# Patient Record
Sex: Female | Born: 1960 | Race: White | Hispanic: No | Marital: Single | Smoking: Never smoker
Health system: Southern US, Community
[De-identification: ages and names within clinical notes are randomized; demographics above are authoritative.]

---

## 2021-05-31 ENCOUNTER — Other Ambulatory Visit (HOSPITAL_BASED_OUTPATIENT_CLINIC_OR_DEPARTMENT_OTHER): Payer: Self-pay

## 2021-05-31 ENCOUNTER — Encounter (HOSPITAL_BASED_OUTPATIENT_CLINIC_OR_DEPARTMENT_OTHER): Payer: Self-pay | Admitting: Nurse Practitioner

## 2021-05-31 ENCOUNTER — Other Ambulatory Visit: Payer: Self-pay

## 2021-05-31 ENCOUNTER — Ambulatory Visit (HOSPITAL_BASED_OUTPATIENT_CLINIC_OR_DEPARTMENT_OTHER): Payer: No Typology Code available for payment source | Admitting: Nurse Practitioner

## 2021-05-31 VITALS — BP 130/88 | HR 64 | Ht 63.0 in | Wt 202.6 lb

## 2021-05-31 DIAGNOSIS — Z1211 Encounter for screening for malignant neoplasm of colon: Secondary | ICD-10-CM

## 2021-05-31 DIAGNOSIS — Z1329 Encounter for screening for other suspected endocrine disorder: Secondary | ICD-10-CM

## 2021-05-31 DIAGNOSIS — J3489 Other specified disorders of nose and nasal sinuses: Secondary | ICD-10-CM | POA: Diagnosis not present

## 2021-05-31 DIAGNOSIS — Z1321 Encounter for screening for nutritional disorder: Secondary | ICD-10-CM

## 2021-05-31 DIAGNOSIS — Z130 Encounter for screening for diseases of the blood and blood-forming organs and certain disorders involving the immune mechanism: Secondary | ICD-10-CM

## 2021-05-31 DIAGNOSIS — E785 Hyperlipidemia, unspecified: Secondary | ICD-10-CM

## 2021-05-31 DIAGNOSIS — Z13228 Encounter for screening for other metabolic disorders: Secondary | ICD-10-CM

## 2021-05-31 MED ORDER — MUPIROCIN 2 % EX OINT
TOPICAL_OINTMENT | CUTANEOUS | 3 refills | Status: DC
  Filled 2021-05-31: qty 22, 14d supply, fill #0

## 2021-05-31 MED ORDER — SULFAMETHOXAZOLE-TRIMETHOPRIM 800-160 MG PO TABS
1.0000 | ORAL_TABLET | Freq: Two times a day (BID) | ORAL | 0 refills | Status: DC
  Filled 2021-05-31: qty 10, 5d supply, fill #0

## 2021-05-31 MED ORDER — ZOSTER VAC RECOMB ADJUVANTED 50 MCG/0.5ML IM SUSR
0.5000 mL | Freq: Once | INTRAMUSCULAR | 1 refills | Status: DC
  Filled 2021-05-31: qty 0.5, 1d supply, fill #0

## 2021-05-31 NOTE — Progress Notes (Signed)
Tollie Eth, DNP, AGNP-c Primary Care & Sports Medicine [address]   [address] [place], [place] [address] [phone]  New patient visit   Patient: Carrie Garrison   DOB: 11-Oct-1960   60 y.o. Female  MRN: [id number] Visit Date: 05/31/2021  Patient Care Team: Daeja Helderman, Sung Amabile, NP as PCP - General (Nurse Practitioner)  Today's healthcare provider: Tollie Eth, NP   Chief Complaint  Patient presents with   New Patient (Initial Visit)   Subjective    Carrie Garrison is a 61 y.o. female who presents today as a new patient to establish care.    Patient endorses the following concerns presently: Recent move back to states after living in [place] for 4 years. No pcp in the last 4 years.   Nasal irritation and sore that is recurrent. Has used ointment provided by doctor in [place] (unsure of the name) and antifungal provided by doctor here. Nothing helping. Reports the sore will begin to heal then comes back. Occasionally experiencing fissuring of the skin at the nostril when the sore is present. No bleeding, pus, redness, warmth. Did take antibiotics at one point and it was improving but returned when she stopped. No hx of cold sores. Has been present for about 2 years on and off.   She tells me she follows a strict Keto diet and gets regular physical activity. She has been seeing a functional medical doctor. History reviewed and reveals the following: History reviewed. No pertinent past medical history. History reviewed. No pertinent surgical history. No family status information on file.   History reviewed. No pertinent family history. Social History   Socioeconomic History   Marital status: Single    Spouse name: Not on file   Number of children: Not on file   Years of education: Not on file   Highest education level: Not on file  Occupational History   Not on file  Tobacco Use   Smoking status: Not on file   Smokeless tobacco: Not on file  Substance  and Sexual Activity   Alcohol use: Not on file   Drug use: Not on file   Sexual activity: Not on file  Other Topics Concern   Not on file  Social History Narrative   Not on file   Social Determinants of Health   Financial Resource Strain: Not on file  Food Insecurity: Not on file  Transportation Needs: Not on file  Physical Activity: Not on file  Stress: Not on file  Social Connections: Not on file   Outpatient Medications Prior to Visit  Medication Sig   metroNIDAZOLE (METROCREAM) 0.75 % cream Apply topically 2 (two) times daily.   No facility-administered medications prior to visit.   No Known Allergies Immunization History  Administered Date(s) Administered   Influenza-Unspecified 01/21/2021    Health Maintenance Due: Health Maintenance  Topic Date Due   HIV Screening  Never done   Hepatitis C Screening  Never done   TETANUS/TDAP  Never done   PAP SMEAR-Modifier  Never done   COLONOSCOPY (Pts 45-73yrs Insurance coverage will need to be confirmed)  Never done   MAMMOGRAM  Never done   Zoster Vaccines- Shingrix (1 of 2) Never done   INFLUENZA VACCINE  Completed   HPV VACCINES  Aged Out    Review of Systems All review of systems negative except what is listed in the HPI   Objective    There were no vitals taken for this visit. Physical Exam  Vitals and nursing note reviewed.  Constitutional:      General: She is not in acute distress.    Appearance: Normal appearance.  HENT:     Nose:     Comments: Mild erythema to the internal mucosal surface of the right nare on the anterior side. No signs of bleeding. There appears to be dried exudate present- non purulent.  Eyes:     Extraocular Movements: Extraocular movements intact.     Conjunctiva/sclera: Conjunctivae normal.     Pupils: Pupils are equal, round, and reactive to light.  Neck:     Vascular: No carotid bruit.  Cardiovascular:     Rate and Rhythm: Normal rate and regular rhythm.     Pulses: Normal  pulses.     Heart sounds: Normal heart sounds. No murmur heard. Pulmonary:     Effort: Pulmonary effort is normal.     Breath sounds: Normal breath sounds. No wheezing.  Abdominal:     General: Bowel sounds are normal.     Palpations: Abdomen is soft.  Musculoskeletal:        General: Normal range of motion.     Cervical back: Normal range of motion.     Right lower leg: No edema.     Left lower leg: No edema.  Skin:    General: Skin is warm and dry.     Capillary Refill: Capillary refill takes less than 2 seconds.  Neurological:     General: No focal deficit present.     Mental Status: She is alert and oriented to person, place, and time.  Psychiatric:        Mood and Affect: Mood normal.        Behavior: Behavior normal.        Thought Content: Thought content normal.        Judgment: Judgment normal.    Results for orders placed or performed in visit on 05/31/21  CBC with Differential/Platelet  Result Value Ref Range   WBC 5.9 3.4 - 10.8 x10E3/uL   RBC 4.83 3.77 - 5.28 x10E6/uL   Hemoglobin 13.9 11.1 - 15.9 g/dL   Hematocrit 43.0 34.0 - 46.6 %   MCV 89 79 - 97 fL   MCH 28.8 26.6 - 33.0 pg   MCHC 32.3 31.5 - 35.7 g/dL   RDW 12.6 11.7 - 15.4 %   Platelets 255 150 - 450 x10E3/uL   Neutrophils 62 Not Estab. %   Lymphs 27 Not Estab. %   Monocytes 8 Not Estab. %   Eos 2 Not Estab. %   Basos 1 Not Estab. %   Neutrophils Absolute 3.7 1.4 - 7.0 x10E3/uL   Lymphocytes Absolute 1.6 0.7 - 3.1 x10E3/uL   Monocytes Absolute 0.5 0.1 - 0.9 x10E3/uL   EOS (ABSOLUTE) 0.1 0.0 - 0.4 x10E3/uL   Basophils Absolute 0.1 0.0 - 0.2 x10E3/uL   Immature Granulocytes 0 Not Estab. %   Immature Grans (Abs) 0.0 0.0 - 0.1 x10E3/uL  Lipid panel  Result Value Ref Range   Cholesterol, Total 302 (H) 100 - 199 mg/dL   Triglycerides 178 (H) 0 - 149 mg/dL   HDL 60 >39 mg/dL   VLDL Cholesterol Cal 34 5 - 40 mg/dL   LDL Chol Calc (NIH) 208 (H) 0 - 99 mg/dL   Lipid Comment: Comment    Chol/HDL  Ratio 5.0 (H) 0.0 - 4.4 ratio  Hemoglobin A1c  Result Value Ref Range   Hgb A1c MFr Bld 5.6 4.8 -  5.6 %   Est. average glucose Bld gHb Est-mCnc 114 mg/dL  TSH  Result Value Ref Range   TSH 1.180 0.450 - 4.500 uIU/mL  VITAMIN D 25 Hydroxy (Vit-D Deficiency, Fractures)  Result Value Ref Range   Vit D, 25-Hydroxy 52.6 30.0 - 100.0 ng/mL    Assessment & Plan      Problem List Items Addressed This Visit   None Visit Diagnoses     Screening for colon cancer    -  Primary   Relevant Orders   Cologuard   Screening for endocrine, nutritional, metabolic and immunity disorder       Relevant Orders   CBC with Differential/Platelet (Completed)   Lipid panel (Completed)   Hemoglobin A1c (Completed)   TSH (Completed)   VITAMIN D 25 Hydroxy (Vit-D Deficiency, Fractures) (Completed)   Nasal lesion       Relevant Medications   sulfamethoxazole-trimethoprim (BACTRIM DS) 800-160 MG tablet   mupirocin ointment (BACTROBAN) 2 %   Hyperlipidemia, unspecified hyperlipidemia type       Relevant Orders   Lipid panel (Completed)        Return for based on labs.     Time: 40 minutes, >50% spent counseling, care coordination, chart review, and documentation.    Mohmed Farver, Coralee Pesa, NP, DNP, AGNP-C Primary Care & Sports Medicine at [place]

## 2021-05-31 NOTE — Patient Instructions (Signed)
Thank you for choosing [facility] for your Primary Care needs. I am excited for the opportunity to partner with you to meet your health care goals. It was a pleasure meeting you today!  Recommendations from today's visit: We will try the oral antibiotic and antibiotic cream to see if this helps the nose ulceration.  I will let you know if we have any concerns with your labs.  I sent the cologaurd order in for you- they will mail the package and instructions to your home.   Information on diet, exercise, and health maintenance recommendations are listed below. This is information to help you be sure you are on track for optimal health and monitoring.   Please look over this and let us know if you have any questions or if you have completed any of the health maintenance outside of [facility] so that we can be sure your records are up to date.  ___________________________________________________________ About Me: I am an Adult-Geriatric Nurse Practitioner with a background in caring for patients for more than 20 years with a strong intensive care background. I provide primary care and sports medicine services to patients age 10 and older within this office. My education had a strong focus on caring for the older adult population, which I am passionate about. I am also the director of the APP Fellowship with [facility].   My desire is to provide you with the best service through preventive medicine and supportive care. I consider you a part of the medical team and value your input. I work diligently to ensure that you are heard and your needs are met in a safe and effective manner. I want you to feel comfortable with me as your provider and want you to know that your health concerns are important to me.  For your information, our office hours are: Monday, Tuesday, and Thursday 8:00 AM - 5:00 PM Wednesday and Friday 8:00 AM - 12:00 PM.   In my time away from the  office I am teaching new APP's within the system and am unavailable, but my partner, Dr. Burnard Bunting is in the office for emergent needs.   If you have questions or concerns, please call our office at [phone] or send [place] a MyChart message and we will respond as quickly as possible.  ____________________________________________________________ MyChart:  For all urgent or time sensitive needs we ask that you please call the office to avoid delays. Our number is [phone]. MyChart is not constantly monitored and due to the large volume of messages a day, replies may take up to 72 business hours.  MyChart Policy: MyChart allows for you to see your visit notes, after visit summary, provider recommendations, lab and tests results, make an appointment, request refills, and contact your provider or the office for non-urgent questions or concerns. Providers are seeing patients during normal business hours and do not have built in time to review MyChart messages.  We ask that you allow a minimum of 3 business days for responses to [employer]. For this reason, please do not send urgent requests through [place]. Please call the office at [phone]. New and ongoing conditions may require a visit. We have virtual and in person visit available for your convenience.  Complex MyChart concerns may require a visit. Your provider may request you schedule a virtual or in person visit to ensure we are providing the best care possible. MyChart messages sent after 11:00 AM on Friday will not be  received by the provider until Monday morning.    Lab and Test Results: You will receive your lab and test results on MyChart as soon as they are completed and results have been sent by the lab or testing facility. Due to this service, you will receive your results BEFORE your provider.  I review lab and tests results each morning prior to seeing patients. Some results require collaboration with other providers to  ensure you are receiving the most appropriate care. For this reason, we ask that you please allow a minimum of 3-5 business days from the time the ALL results have been received for your provider to receive and review lab and test results and contact you about these.  Most lab and test result comments from the provider will be sent through [place]. Your provider may recommend changes to the plan of care, follow-up visits, repeat testing, ask questions, or request an office visit to discuss these results. You may reply directly to this message or call the office at [phone] to provide information for the provider or set up an appointment. In some instances, you will be called with test results and recommendations. Please let us know if this is preferred and we will make note of this in your chart to provide this for you.    If you have not heard a response to your lab or test results in 5 business days from all results returning to [facility], please call the office to let us know. We ask that you please avoid calling prior to this time unless there is an emergent concern. Due to high call volumes, this can delay the resulting process.  After Hours: For all non-emergency after hours needs, please call the office at [phone] and select the option to reach the on-call provider service. On-call services are shared between multiple [place] offices and therefore it will not be possible to speak directly with your provider. On-call providers may provide medical advice and recommendations, but are unable to provide refills for maintenance medications.  For all emergency or urgent medical needs after normal business hours, we recommend that you seek care at the closest Urgent Care or Emergency Department to ensure appropriate treatment in a timely manner.  [facility] [place] has a 24 hour emergency room located on the ground floor for your convenience.   Urgent Concerns During the Business  Day Providers are seeing patients from 8AM to [place] with a busy schedule and are most often not able to respond to non-urgent calls until the end of the day or the next business day. If you should have URGENT concerns during the day, please call and speak to the nurse or schedule a same day appointment so that we can address your concern without delay.   Thank you, again, for choosing me as your health care partner. I appreciate your trust and look forward to learning more about you.   Worthy Keeler, DNP, AGNP-c ___________________________________________________________  Health Maintenance Recommendations Screening Testing Mammogram Every 1 -2 years based on history and risk factors Starting at age 81 Pap Smear Ages 21-39 every 3 years Ages 7-65 every 5 years with HPV testing More frequent testing may be required based on results and history Colon Cancer Screening Every 1-10 years based on test performed, risk factors, and history Starting at age 37 Bone Density Screening Every 2-10 years based on history Starting at age 46 for women Recommendations for men differ based on medication usage, history, and risk factors AAA  Screening One time ultrasound Men 12-55 years old who have every smoked Lung Cancer Screening Low Dose Lung CT every 12 months Age 31-80 years with a 30 pack-year smoking history who still smoke or who have quit within the last 15 years  Screening Labs Routine  Labs: Complete Blood Count (CBC), Complete Metabolic Panel (CMP), Cholesterol (Lipid Panel) Every 6-12 months based on history and medications May be recommended more frequently based on current conditions or previous results Hemoglobin A1c Lab Every 3-12 months based on history and previous results Starting at age 62 or earlier with diagnosis of diabetes, high cholesterol, BMI >26, and/or risk factors Frequent monitoring for patients with diabetes to ensure blood sugar control Thyroid Panel (TSH w/ T3  & T4) Every 6 months based on history, symptoms, and risk factors May be repeated more often if on medication HIV One time testing for all patients 27 and older May be repeated more frequently for patients with increased risk factors or exposure Hepatitis C One time testing for all patients 61 and older May be repeated more frequently for patients with increased risk factors or exposure Gonorrhea, Chlamydia Every 12 months for all sexually active persons 13-24 years Additional monitoring may be recommended for those who are considered high risk or who have symptoms PSA Men 19-43 years old with risk factors Additional screening may be recommended from age 43-69 based on risk factors, symptoms, and history  Vaccine Recommendations Tetanus Booster All adults every 10 years Flu Vaccine All patients 6 months and older every year COVID Vaccine All patients 12 years and older Initial dosing with booster May recommend additional booster based on age and health history HPV Vaccine 2 doses all patients age 49-26 Dosing may be considered for patients over 26 Shingles Vaccine (Shingrix) 2 doses all adults 25 years and older Pneumonia (Pneumovax 23) All adults 62 years and older May recommend earlier dosing based on health history Pneumonia (Prevnar 35) All adults 65 years and older Dosed 1 year after Pneumovax 23  Additional Screening, Testing, and Vaccinations may be recommended on an individualized basis based on family history, health history, risk factors, and/or exposure.  __________________________________________________________  Diet Recommendations for All Patients  I recommend that all patients maintain a diet low in saturated fats, carbohydrates, and cholesterol. While this can be challenging at first, it is not impossible and small changes can make big differences.  Things to try: Decreasing the amount of soda, sweet tea, and/or juice to one or less per day and replace with  water While water is always the first choice, if you do not like water you may consider adding a water additive without sugar to improve the taste other sugar free drinks Replace potatoes with a brightly colored vegetable at dinner Use healthy oils, such as canola oil or olive oil, instead of butter or hard margarine Limit your bread intake to two pieces or less a day Replace regular pasta with low carb pasta options Bake, broil, or grill foods instead of frying Monitor portion sizes  Eat smaller, more frequent meals throughout the day instead of large meals  An important thing to remember is, if you love foods that are not great for your health, you don't have to give them up completely. Instead, allow these foods to be a reward when you have done well. Allowing yourself to still have special treats every once in a while is a nice way to tell yourself thank you for working hard to keep yourself healthy.  Also remember that every day is a new day. If you have a bad day and "fall off the wagon", you can still climb right back up and keep moving along on your journey!  We have resources available to help you!  Some websites that may be helpful include: www.http://carter.biz/  Www.VeryWellFit.com _____________________________________________________________  Activity Recommendations for All Patients  I recommend that all adults get at least 20 minutes of moderate physical activity that elevates your heart rate at least 5 days out of the week.  Some examples include: Walking or jogging at a pace that allows you to carry on a conversation Cycling (stationary bike or outdoors) Water aerobics Yoga Weight lifting Dancing If physical limitations prevent you from putting stress on your joints, exercise in a pool or seated in a chair are excellent options.  Do determine your MAXIMUM heart rate for activity: YOUR AGE - 220 = MAX HeartRate   Remember! Do not push yourself too hard.  Start slowly  and build up your pace, speed, weight, time in exercise, etc.  Allow your body to rest between exercise and get good sleep. You will need more water than normal when you are exerting yourself. Do not wait until you are thirsty to drink. Drink with a purpose of getting in at least 8, 8 ounce glasses of water a day plus more depending on how much you exercise and sweat.    If you begin to develop dizziness, chest pain, abdominal pain, jaw pain, shortness of breath, headache, vision changes, lightheadedness, or other concerning symptoms, stop the activity and allow your body to rest. If your symptoms are severe, seek emergency evaluation immediately. If your symptoms are concerning, but not severe, please let us know so that we can recommend further evaluation.

## 2021-06-01 LAB — CBC WITH DIFFERENTIAL/PLATELET
Basophils Absolute: 0.1 10*3/uL (ref 0.0–0.2)
Basos: 1 %
EOS (ABSOLUTE): 0.1 10*3/uL (ref 0.0–0.4)
Eos: 2 %
Hematocrit: 43 % (ref 34.0–46.6)
Hemoglobin: 13.9 g/dL (ref 11.1–15.9)
Immature Grans (Abs): 0 10*3/uL (ref 0.0–0.1)
Immature Granulocytes: 0 %
Lymphocytes Absolute: 1.6 10*3/uL (ref 0.7–3.1)
Lymphs: 27 %
MCH: 28.8 pg (ref 26.6–33.0)
MCHC: 32.3 g/dL (ref 31.5–35.7)
MCV: 89 fL (ref 79–97)
Monocytes Absolute: 0.5 10*3/uL (ref 0.1–0.9)
Monocytes: 8 %
Neutrophils Absolute: 3.7 10*3/uL (ref 1.4–7.0)
Neutrophils: 62 %
Platelets: 255 10*3/uL (ref 150–450)
RBC: 4.83 x10E6/uL (ref 3.77–5.28)
RDW: 12.6 % (ref 11.7–15.4)
WBC: 5.9 10*3/uL (ref 3.4–10.8)

## 2021-06-01 LAB — LIPID PANEL
Chol/HDL Ratio: 5 ratio — ABNORMAL HIGH (ref 0.0–4.4)
Cholesterol, Total: 302 mg/dL — ABNORMAL HIGH (ref 100–199)
HDL: 60 mg/dL (ref >39)
LDL Chol Calc (NIH): 208 mg/dL — ABNORMAL HIGH (ref 0–99)
Triglycerides: 178 mg/dL — ABNORMAL HIGH (ref 0–149)
VLDL Cholesterol Cal: 34 mg/dL (ref 5–40)

## 2021-06-01 LAB — HEMOGLOBIN A1C
Est. average glucose Bld gHb Est-mCnc: 114 mg/dL
Hgb A1c MFr Bld: 5.6 % (ref 4.8–5.6)

## 2021-06-01 LAB — VITAMIN D 25 HYDROXY (VIT D DEFICIENCY, FRACTURES): Vit D, 25-Hydroxy: 52.6 ng/mL (ref 30.0–100.0)

## 2021-06-01 LAB — TSH: TSH: 1.18 u[IU]/mL (ref 0.450–4.500)

## 2021-06-02 ENCOUNTER — Encounter (HOSPITAL_BASED_OUTPATIENT_CLINIC_OR_DEPARTMENT_OTHER): Payer: Self-pay | Admitting: Nurse Practitioner

## 2021-06-02 DIAGNOSIS — E7801 Familial hypercholesterolemia: Secondary | ICD-10-CM | POA: Insufficient documentation

## 2021-06-02 DIAGNOSIS — J3489 Other specified disorders of nose and nasal sinuses: Secondary | ICD-10-CM | POA: Insufficient documentation

## 2021-06-02 DIAGNOSIS — E785 Hyperlipidemia, unspecified: Secondary | ICD-10-CM | POA: Insufficient documentation

## 2021-06-02 NOTE — Assessment & Plan Note (Signed)
Hx of elevated lipids. Will obtain labs today for evaluation and make changes to plan of care as necessary based on findings.

## 2021-06-02 NOTE — Assessment & Plan Note (Signed)
Etiology unclear. It appears she has failed topical antibacterial and antifungal treatment measures.  Will try treatment with oral antibiotic therapy and topical mupirocin to see if we can get this cleared.  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  She will follow-up if no improvement.

## 2021-06-02 NOTE — Assessment & Plan Note (Signed)
>>  ASSESSMENT AND PLAN FOR HYPERLIPIDEMIA WRITTEN ON 06/02/2021  8:24 AM BY Betsi Crespi E, NP  Hx of elevated lipids. Will obtain labs today for evaluation and make changes to plan of care as necessary based on findings.

## 2021-06-05 ENCOUNTER — Other Ambulatory Visit (HOSPITAL_BASED_OUTPATIENT_CLINIC_OR_DEPARTMENT_OTHER): Payer: Self-pay

## 2021-06-05 ENCOUNTER — Telehealth (HOSPITAL_BASED_OUTPATIENT_CLINIC_OR_DEPARTMENT_OTHER): Payer: Self-pay

## 2021-06-05 MED ORDER — ROSUVASTATIN CALCIUM 20 MG PO TABS
20.0000 mg | ORAL_TABLET | Freq: Every day | ORAL | 0 refills | Status: DC
  Filled 2021-06-05: qty 30, 30d supply, fill #0
  Filled 2021-07-02: qty 30, 30d supply, fill #1
  Filled 2021-08-07: qty 30, 30d supply, fill #2

## 2021-06-05 NOTE — Telephone Encounter (Signed)
Spoke with regarding lab results. Rosuvastatin was recommend for patient she was agreeable to this and ask that the medication be called into [facility].

## 2021-06-05 NOTE — Telephone Encounter (Signed)
-----   Message from Tollie Eth, NP sent at 06/01/2021  7:03 PM EST ----- Please call patient.  Cholesterol is significantly elevated.  If patient was fasting at the time of labs we will need to get her started on medication to help better control this.  If she is agreeable recommend rosuvastatin 20 mg at bedtime daily for hyperlipidemia. If patient was not fasting we will plan to recheck the labs in approximately 12 weeks with fasting labs.  CBC shows no signs of anemia or infection present. Hemoglobin A1c is within normal range.  No signs of diabetes or prediabetes at this time.  Thyroid panel is normal. Vitamin D looks fantastic!  Current recommendations are for rosuvastatin 20 mg once daily at bedtime.  We will need to set up lab recheck in approximately 12 weeks for CMP and lipids after start.

## 2021-06-06 ENCOUNTER — Other Ambulatory Visit (HOSPITAL_BASED_OUTPATIENT_CLINIC_OR_DEPARTMENT_OTHER): Payer: Self-pay

## 2021-06-06 ENCOUNTER — Emergency Department (HOSPITAL_BASED_OUTPATIENT_CLINIC_OR_DEPARTMENT_OTHER)
Admission: EM | Admit: 2021-06-06 | Discharge: 2021-06-06 | Disposition: A | Discharge status: Home / Self Care | Payer: No Typology Code available for payment source | Attending: Emergency Medicine | Admitting: Emergency Medicine

## 2021-06-06 ENCOUNTER — Encounter (HOSPITAL_BASED_OUTPATIENT_CLINIC_OR_DEPARTMENT_OTHER): Payer: Self-pay | Admitting: Emergency Medicine

## 2021-06-06 ENCOUNTER — Other Ambulatory Visit: Payer: Self-pay

## 2021-06-06 DIAGNOSIS — L270 Generalized skin eruption due to drugs and medicaments taken internally: Secondary | ICD-10-CM | POA: Diagnosis not present

## 2021-06-06 DIAGNOSIS — R21 Rash and other nonspecific skin eruption: Secondary | ICD-10-CM | POA: Diagnosis present

## 2021-06-06 MED ORDER — ZOSTER VAC RECOMB ADJUVANTED 50 MCG/0.5ML IM SUSR
INTRAMUSCULAR | 1 refills | Status: DC
  Filled 2021-06-06: qty 0.5, 1d supply, fill #0

## 2021-06-06 NOTE — ED Provider Notes (Signed)
[facility] EMERGENCY DEPT Provider Note   CSN: [id number] Arrival date & time: 06/06/21  5277     History  No chief complaint on file.   Terryn Rosenkranz is a 61 y.o. female.  HPI  61 year old female presenting to the emergency department with a chief complaint of rash. The patient recently started taking Inositol this past Friday, Saturday, and Sunday.  Additionally, the patient recently completed a course of Bactrim on Sunday.  This morning she woke up with a diffuse maculopapular rash that covers her entire body from her extremities to her torso. It spares the palms and soles. She is up to date on all of her vaccines. No fever or chills. She has been having night sweats. No tick exposure that she can think of.   Home Medications Prior to Admission medications   Medication Sig Start Date End Date Taking? Authorizing Provider  rosuvastatin (CRESTOR) 20 MG tablet Take 1 tablet (20 mg total) by mouth at bedtime. 06/05/21   Tollie Eth, NP  metroNIDAZOLE (METROCREAM) 0.75 % cream Apply topically 2 (two) times daily. 03/09/21   [provider]  mupirocin ointment (BACTROBAN) 2 % Apply to affected area BID for 14 days. 05/31/21   Tollie Eth, NP  Zoster Vaccine Adjuvanted [facility]) injection Inject into the muscle. 06/06/21         Allergies    Bactrim [sulfamethoxazole-trimethoprim]    Review of Systems   Review of Systems  Skin:  Positive for rash.  All other systems reviewed and are negative.  Physical Exam Updated Vital Signs BP 124/68    Pulse (!) 59    Temp 97.7 F (36.5 C) (Oral)    Resp 16    Ht 5\' 3"  (1.6 m)    Wt 88.5 kg    SpO2 97%    BMI 34.54 kg/m  Physical Exam Vitals and nursing note reviewed.  Constitutional:      General: She is not in acute distress. HENT:     Head: Normocephalic and atraumatic.  Eyes:     Conjunctiva/sclera: Conjunctivae normal.     Pupils: Pupils are equal, round, and reactive to light.  Cardiovascular:      Rate and Rhythm: Normal rate and regular rhythm.  Pulmonary:     Effort: Pulmonary effort is normal. No respiratory distress.  Abdominal:     General: There is no distension.     Tenderness: There is no guarding.  Musculoskeletal:        General: No deformity or signs of injury.     Cervical back: Neck supple.  Skin:    Findings: No lesion or rash.     Comments: Diffuse  blanching, erythematous maculopapular rash along the patient's torso, abdomen, all 4 extremities, sparing the palms and soles.  No mucous membrane involvement.  Neurological:     General: No focal deficit present.     Mental Status: She is alert. Mental status is at baseline.    ED Results / Procedures / Treatments   Labs (all labs ordered are listed, but only abnormal results are displayed) Labs Reviewed - No data to display  EKG None  Radiology No results found.  Procedures Procedures    Medications Ordered in ED Medications - No data to display  ED Course/ Medical Decision Making/ A&P                           Medical Decision Making   61 year old  female presenting to the emergency department with a chief complaint of rash. The patient recently started taking Inositol this past Friday, Saturday, and Sunday.  Additionally, the patient recently completed a course of Bactrim on Sunday.  This morning she woke up with a diffuse maculopapular rash that covers her entire body from her extremities to her torso. It spares the palms and soles. She is up to date on all of her vaccines. No fever or chills. She has been having night sweats. No tick exposure that she can think of.   On arrival, the patient was afebrile, hemodynamically stable.  Her physical exam was significant for a diffuse, erythematous, blanching maculopapular rash that covers the patient's torso, abdomen in all 4 extremities.  This rash appeared a few days after she finished a course of Bactrim.  She has no known prior history of allergic reaction  to Bactrim or sulfa drugs.  Suspect likely drug rash.  The patient has ceased the offending agent.  Low suspicion for SJS, TENS at this time.  The rash spares the palms and soles and the patient is afebrile and overall well-appearing.  She denies any mucosal involvement of her vagina or mouth.  The patient was offered topical corticosteroids for symptomatic management but declined.  I discussed return precautions in the event of worsening symptoms of SJS.  Bactrim was added to the patient's allergy list and she was advised to avoid that drug and sulfa containing drugs in the future.  Advised routine outpatient follow-up.    Final Clinical Impression(s) / ED Diagnoses Final diagnoses:  Drug eruption    Rx / DC Orders ED Discharge Orders     None         Ernie Avena, MD 06/06/21 2234

## 2021-06-06 NOTE — ED Triage Notes (Signed)
Pt reports waking up with rash all over her body. Redness worse on back. Denies SOB.

## 2021-06-06 NOTE — Discharge Instructions (Addendum)
Your symptoms are concerning for a fixed drug eruption after Bactrim use.  Recommend discontinuing the medication.  Return for any mucous membrane involvement to include desquamation or sloughing.

## 2021-06-08 ENCOUNTER — Encounter (HOSPITAL_BASED_OUTPATIENT_CLINIC_OR_DEPARTMENT_OTHER): Payer: Self-pay | Admitting: Nurse Practitioner

## 2021-06-11 LAB — COLOGUARD: COLOGUARD: NEGATIVE

## 2021-06-12 ENCOUNTER — Encounter (HOSPITAL_BASED_OUTPATIENT_CLINIC_OR_DEPARTMENT_OTHER): Payer: Self-pay | Admitting: Nurse Practitioner

## 2021-06-13 ENCOUNTER — Other Ambulatory Visit (HOSPITAL_BASED_OUTPATIENT_CLINIC_OR_DEPARTMENT_OTHER): Payer: Self-pay | Admitting: Nurse Practitioner

## 2021-06-13 DIAGNOSIS — E782 Mixed hyperlipidemia: Secondary | ICD-10-CM

## 2021-06-16 NOTE — Progress Notes (Signed)
Cologuard results are normal. We will plan to repeat in 3 years unless there are any issues.   Have a great day! [name]

## 2021-06-19 ENCOUNTER — Other Ambulatory Visit (HOSPITAL_BASED_OUTPATIENT_CLINIC_OR_DEPARTMENT_OTHER): Payer: Self-pay

## 2021-06-19 ENCOUNTER — Other Ambulatory Visit (HOSPITAL_BASED_OUTPATIENT_CLINIC_OR_DEPARTMENT_OTHER): Payer: Self-pay | Admitting: Nurse Practitioner

## 2021-06-19 DIAGNOSIS — R4584 Anhedonia: Secondary | ICD-10-CM

## 2021-06-19 MED ORDER — SERTRALINE HCL 50 MG PO TABS
75.0000 mg | ORAL_TABLET | Freq: Every day | ORAL | 3 refills | Status: DC
  Filled 2021-06-19: qty 45, 30d supply, fill #0
  Filled 2021-08-07: qty 45, 30d supply, fill #1
  Filled 2021-08-28 – 2021-08-30 (×2): qty 45, 30d supply, fill #2
  Filled 2021-10-02: qty 45, 30d supply, fill #3
  Filled 2021-10-30: qty 45, 30d supply, fill #4
  Filled 2021-11-27: qty 45, 30d supply, fill #5
  Filled 2021-12-27: qty 45, 30d supply, fill #6
  Filled 2022-01-22: qty 45, 30d supply, fill #7

## 2021-06-22 ENCOUNTER — Telehealth (HOSPITAL_BASED_OUTPATIENT_CLINIC_OR_DEPARTMENT_OTHER): Payer: Self-pay | Admitting: Nurse Practitioner

## 2021-06-22 NOTE — Telephone Encounter (Signed)
Patient contact me with concerns that she received a mychart notification that her recent cardiology referral was sent to me as the provider. Can you please check on this for me? Please update the patient on the location of the referral. Thank you! ?

## 2021-06-29 ENCOUNTER — Encounter (HOSPITAL_BASED_OUTPATIENT_CLINIC_OR_DEPARTMENT_OTHER): Payer: Self-pay | Admitting: Nurse Practitioner

## 2021-06-30 ENCOUNTER — Other Ambulatory Visit (HOSPITAL_BASED_OUTPATIENT_CLINIC_OR_DEPARTMENT_OTHER): Payer: Self-pay | Admitting: Nurse Practitioner

## 2021-06-30 ENCOUNTER — Other Ambulatory Visit (HOSPITAL_BASED_OUTPATIENT_CLINIC_OR_DEPARTMENT_OTHER): Payer: Self-pay

## 2021-06-30 DIAGNOSIS — A048 Other specified bacterial intestinal infections: Secondary | ICD-10-CM

## 2021-06-30 MED ORDER — PANTOPRAZOLE SODIUM 40 MG PO TBEC
40.0000 mg | DELAYED_RELEASE_TABLET | Freq: Every day | ORAL | 0 refills | Status: DC
  Filled 2021-06-30: qty 14, 14d supply, fill #0

## 2021-06-30 MED ORDER — AMOXICILLIN 500 MG PO CAPS
1000.0000 mg | ORAL_CAPSULE | Freq: Two times a day (BID) | ORAL | 0 refills | Status: AC
  Filled 2021-06-30: qty 56, 14d supply, fill #0

## 2021-06-30 MED ORDER — CLARITHROMYCIN 500 MG PO TABS
500.0000 mg | ORAL_TABLET | Freq: Two times a day (BID) | ORAL | 0 refills | Status: AC
Start: 2021-06-30 — End: 2021-07-14
  Filled 2021-06-30: qty 20, 10d supply, fill #0
  Filled 2021-06-30: qty 8, 4d supply, fill #0

## 2021-07-03 ENCOUNTER — Other Ambulatory Visit (HOSPITAL_BASED_OUTPATIENT_CLINIC_OR_DEPARTMENT_OTHER): Payer: Self-pay

## 2021-07-13 NOTE — Progress Notes (Signed)
Cardiology Office Note:    Date:  07/17/2021   ID:  Carrie Garrison, DOB [birth year], MRN [id number]  PCP:  Tollie Eth, NP  Cardiologist:  Jodelle Red, MD  Referring MD: Tollie Eth, NP   CC: new patient consultation for hyperlipidemia  History of Present Illness:    Carrie Garrison is a 61 y.o. female with a hx of hyperlipidemia, who is seen as a new consult at the request of Early, Sung Amabile, NP for the evaluation and management of hyperlipidemia.  Notes from Enid Skeens, NP reviewed. Her last visit was on 05/31/2021. She had recently [place] back [place] the [place] after living in [place] for 4 years. Carrie Garrison contacted Enid Skeens, NP on 06/08/2021 and noted having high cholesterol her entire life. She was referred to cardiology for consideration of cardiac CT and statin management. She was on rosuvastatin at that time.  She was seen in the ED 06/06/2021 with acute, diffuse maculopapular rash covering her entire body from her extremities to her torso, with night sweats. She had recently started taking Inositol, and completed a course of Bactrim. Topical corticosteroids were offered but declined.  Cardiovascular risk factors: Prior clinical ASCVD: None. Generally she is not experiencing any cardiovascular symptoms or issues. She is mostly concerned due to her family history, noted below. Comorbid conditions: Hyperlipidemia - When she was 61 yo her cholesterol was 300. She has been on medication previously, never lower than 240 on meds.  Her lab work of 05/31/21 was taken before starting crestor. Metabolic syndrome/Obesity: Highest adult weight 220 lbs, her current weight is 206 lbs. Chronic inflammatory conditions: She is working with a functional medicine physician on "leaky gut syndrome". GI testing revealed inflammation and H pylori infection. She has been treated with antibiotics. Tobacco use history: Never. Family history: "Everybody" has hyperlipidemia. Her father recently died at 63  yo, had cholesterol of 300. Her mother died at 30 yo due to heart attack, her cholesterol was over 600. Her maternal grandmother had angina. She has siblings without any cardiovascular issues. Prior cardiac testing and/or incidental findings on other testing (ie coronary calcium): Only one time she was told that she had a heart murmur. Exercise level: She goes swimming 4-5 times a week. Lately she started walking for about an hour every morning. Current diet: Strict Keto diet. Protein, vegetables, goat cheese.  She denies any palpitations, chest pain, shortness of breath, or peripheral edema. No lightheadedness, headaches, syncope, orthopnea, or PND.  History reviewed. No pertinent past medical history.  History reviewed. No pertinent surgical history.  Current Medications: Current Outpatient Medications on File Prior to Visit  Medication Sig   rosuvastatin (CRESTOR) 20 MG tablet Take 1 tablet (20 mg total) by mouth at bedtime.   sertraline (ZOLOFT) 50 MG tablet Take 1.5 tablets (75 mg total) by mouth daily.   No current facility-administered medications on file prior to visit.     Allergies:   Bactrim [sulfamethoxazole-trimethoprim]   Social History   Tobacco Use   Smoking status: Never   Smokeless tobacco: Never  Substance Use Topics   Alcohol use: Not Currently   Drug use: Not Currently    Family History: "Everybody" has hyperlipidemia. Her father recently died at 56 yo, had cholesterol of 300. Her mother died at 54 yo due to heart attack, her cholesterol was over 600. Her maternal grandmother had angina. She has siblings without any cardiovascular issues.  ROS:   Please see the history of present illness.  Additional pertinent  ROS: Constitutional: Negative for chills, fever, night sweats, unintentional weight loss  HENT: Negative for ear pain and hearing loss.   Eyes: Negative for loss of vision and eye pain.  Respiratory: Negative for cough, sputum, wheezing.    Cardiovascular: See HPI. Gastrointestinal: Negative for abdominal pain, melena, and hematochezia.  Genitourinary: Negative for dysuria and hematuria.  Musculoskeletal: Negative for falls and myalgias.  Skin: Negative for itching. Positive for rash due to adverse reaction 05/2021, continues to improve. Neurological: Negative for focal weakness, focal sensory changes and loss of consciousness.  Endo/Heme/Allergies: Does not bruise/bleed easily.     EKGs/Labs/Other Studies Reviewed:    The following studies were reviewed today:  No prior cardiovascular studies available.   EKG:  EKG is personally reviewed.   07/17/2021: NSR, small inferior Q waves, nonspecific ST-T pattern. No prior available  Recent Labs: 05/31/2021: Hemoglobin 13.9; Platelets 255; TSH 1.180   Recent Lipid Panel    Component Value Date/Time   CHOL 302 (H) 05/31/2021 1055   TRIG 178 (H) 05/31/2021 1055   HDL 60 05/31/2021 1055   CHOLHDL 5.0 (H) 05/31/2021 1055   LDLCALC 208 (H) 05/31/2021 1055    Physical Exam:    VS:  BP 118/72   Pulse 65   Ht 5' 2.5" (1.588 m)   Wt 206 lb 1.6 oz (93.5 kg)   BMI 37.10 kg/m     Wt Readings from Last 3 Encounters:  07/17/21 206 lb 1.6 oz (93.5 kg)  06/06/21 195 lb (88.5 kg)  05/31/21 202 lb 9.6 oz (91.9 kg)    GEN: Well nourished, well developed in no acute distress HEENT: Normal, moist mucous membranes NECK: No JVD CARDIAC: regular rhythm, normal S1 and S2, no rubs or gallops. No murmur. VASCULAR: Radial and DP pulses 2+ bilaterally. No carotid bruits RESPIRATORY:  Clear to auscultation without rales, wheezing or rhonchi  ABDOMEN: Soft, non-tender, non-distended MUSCULOSKELETAL:  Ambulates independently SKIN: Warm and dry, no edema NEUROLOGIC:  Alert and oriented x 3. No focal neuro deficits noted. PSYCHIATRIC:  Normal affect    ASSESSMENT:    1. Heterozygous familial hypercholesterolemia   2. Cardiac risk counseling   3. Counseling on health promotion and  disease prevention   4. Exercise counseling   5. Nutritional counseling   6. Pure hypercholesterolemia    PLAN:    Hypercholesterolemia, LDL 208 prior to statin, consistent with heterozygous familial hypercholesterolemia -has been started on rosuvastatin 20 mg recently, not yet due for recheck -we discussed calcium score today. She is already on a statin but this would help [place] determine burden of disease and potentially how aggressive we need to be. She understands it is self pay and wishes to proceed  Cardiac risk counseling and prevention recommendations: -recommend heart healthy/Mediterranean diet, with whole grains, fruits, vegetable, fish, lean meats, nuts, and olive oil. Limit salt. -recommend moderate walking, 3-5 times/week for 30-50 minutes each session. Aim for at least 150 minutes.week. Goal should be pace of 3 miles/hours, or walking 1.5 miles in 30 minutes -recommend avoidance of tobacco products. Avoid excess alcohol. -ASCVD risk score: The 10-year ASCVD risk score (Arnett DK, et al., 2019) is: 3.7%   Values used to calculate the score:     Age: 37 years     Sex: Female     Is Non-Hispanic African American: No     Diabetic: No     Tobacco smoker: No     Systolic Blood Pressure: 118 mmHg     Is BP  treated: No     HDL Cholesterol: 60 mg/dL     Total Cholesterol: 302 mg/dL    Plan for follow up: 1 year or sooner as needed.  Jodelle Red, MD, PhD, [facility] [place]  [facility]    Medication Adjustments/Labs and Tests Ordered: Current medicines are reviewed at length with the patient today.  Concerns regarding medicines are outlined above.   Orders Placed This Encounter  Procedures   CT CARDIAC SCORING (SELF PAY ONLY)   EKG 12-Lead   No orders of the defined types were placed in this encounter.  Patient Instructions  Medication Instructions:  Your Physician recommend you continue on your current medication as directed.    *If you need a refill on  your cardiac medications before your next appointment, please call your pharmacy*   Lab Work: None ordered today   Testing/Procedures: CT coronary calcium score.   Test locations:  [facility] ([address] [place], [place] [address]) [facility] ([address] [place], [place] [address])   This is $99 out of pocket.   Coronary CalciumScan A coronary calcium scan is an imaging test used to look for deposits of calcium and other fatty materials (plaques) in the inner lining of the blood vessels of the heart (coronary arteries). These deposits of calcium and plaques can partly clog and narrow the coronary arteries without producing any symptoms or warning signs. This puts a person at risk for a heart attack. This test can detect these deposits before symptoms develop. Tell a health care provider about: Any allergies you have. All medicines you are taking, including vitamins, herbs, eye drops, creams, and over-the-counter medicines. Any problems you or family members have had with anesthetic medicines. Any blood disorders you have. Any surgeries you have had. Any medical conditions you have. Whether you are pregnant or may be pregnant. What are the risks? Generally, this is a safe procedure. However, problems may occur, including: Harm to a pregnant woman and her unborn baby. This test involves the use of radiation. Radiation exposure can be dangerous to a pregnant woman and her unborn baby. If you are pregnant, you generally should not have this procedure done. Slight increase in the risk of cancer. This is because of the radiation involved in the test. What happens before the procedure? No preparation is needed for this procedure. What happens during the procedure? You will undress and remove any jewelry around your neck or chest. You will put on a hospital gown. Sticky electrodes will be placed on your chest. The electrodes will be connected to an  electrocardiogram (ECG) machine to record a tracing of the electrical activity of your heart. A CT scanner will take pictures of your heart. During this time, you will be asked to lie still and hold your breath for 2-3 seconds while a picture of your heart is being taken. The procedure may vary among health care providers and hospitals. What happens after the procedure? You can get dressed. You can return to your normal activities. It is up to you to get the results of your test. Ask your health care provider, or the department that is doing the test, when your results will be ready. Summary A coronary calcium scan is an imaging test used to look for deposits of calcium and other fatty materials (plaques) in the inner lining of the blood vessels of the heart (coronary arteries). Generally, this is a safe procedure. Tell your health care provider if you are pregnant or  may be pregnant. No preparation is needed for this procedure. A CT scanner will take pictures of your heart. You can return to your normal activities after the scan is done. This information is not intended to replace advice given to you by your health care provider. Make sure you discuss any questions you have with your health care provider. Document Released: 10/06/2007 Document Revised: 02/27/2016 Document Reviewed: 02/27/2016 Elsevier Interactive Patient Education  2017 [employer].    Follow-Up: At [facility], you and your health needs are our priority.  As part of our continuing mission to provide you with exceptional heart care, we have created designated Provider Care Teams.  These Care Teams include your primary Cardiologist (physician) and Advanced Practice Providers (APPs -  Physician Assistants and Nurse Practitioners) who all work together to provide you with the care you need, when you need it.  We recommend signing up for the patient portal called "MyChart".  Sign up information is provided on this After Visit  Summary.  MyChart is used to connect with patients for Virtual Visits (Telemedicine).  Patients are able to view lab/test results, encounter notes, upcoming appointments, etc.  Non-urgent messages can be sent to your provider as well.   To learn more about what you can do with MyChart, go to [email or web address].    Your next appointment:   1 year(s)  The format for your next appointment:   In Person  Provider:   Jodelle Red, MD{  Recent data, summarized from [facility] from a study from the [facility]:  C[employer]Wizard.com.pt  "Researchers at the [facility] set out to answer this question by comparing statins to supplements in a clinical trial. They tracked the outcomes of 190 adults, ages 69 to 36. Some participants were given a 5 mg daily dose of rosuvastatin, a statin that is sold under the brand name Crestor for 28 days. Others were given supplements, including fish oil, cinnamon, garlic, turmeric, plant sterols or red yeast rice for the same period."  "What we found was that rosuvastatin lowered LDL cholesterol by almost 78% and that was vastly superior to placebo and any of the six supplements studied in the trial," study Kayren Eaves, M.D. of the [facility] told NPR. He says this level of reduction is enough to lower the risk of heart attacks and strokes. The findings are published in the Journal of the [employer] of Cardiology.  "Oftentimes these supplements are marketed as 'natural ways' to lower your cholesterol," says Laffin. But he says none of the dietary supplements demonstrated any significant decrease in LDL cholesterol compared with a placebo. LDL cholesterol is considered the 'bad cholesterol' because it can contribute to plaque build-up in the artery walls - which can narrow the  arteries, and set the stage for heart attacks and strokes"    I,Mathew Stumpf,acting as a scribe for [employer], MD.,have documented all relevant documentation on the behalf of Jodelle Red, MD,as directed by  Jodelle Red, MD while in the presence of Jodelle Red, MD.  I, Jodelle Red, MD, have reviewed all documentation for this visit. The documentation on 07/17/21 for the exam, diagnosis, procedures, and orders are all accurate and complete.   Signed, Jodelle Red, MD PhD 07/17/2021 7:42 PM    [facility]

## 2021-07-17 ENCOUNTER — Other Ambulatory Visit: Payer: Self-pay

## 2021-07-17 ENCOUNTER — Ambulatory Visit (INDEPENDENT_AMBULATORY_CARE_PROVIDER_SITE_OTHER): Payer: No Typology Code available for payment source | Admitting: Cardiology

## 2021-07-17 ENCOUNTER — Encounter (HOSPITAL_BASED_OUTPATIENT_CLINIC_OR_DEPARTMENT_OTHER): Payer: Self-pay | Admitting: Cardiology

## 2021-07-17 VITALS — BP 118/72 | HR 65 | Ht 62.5 in | Wt 206.1 lb

## 2021-07-17 DIAGNOSIS — E7801 Familial hypercholesterolemia: Secondary | ICD-10-CM | POA: Insufficient documentation

## 2021-07-17 DIAGNOSIS — Z7189 Other specified counseling: Secondary | ICD-10-CM

## 2021-07-17 DIAGNOSIS — E7800 Pure hypercholesterolemia, unspecified: Secondary | ICD-10-CM | POA: Diagnosis not present

## 2021-07-17 DIAGNOSIS — Z7182 Exercise counseling: Secondary | ICD-10-CM | POA: Diagnosis not present

## 2021-07-17 DIAGNOSIS — Z713 Dietary counseling and surveillance: Secondary | ICD-10-CM | POA: Diagnosis not present

## 2021-07-17 NOTE — Patient Instructions (Signed)
Medication Instructions:  °Your Physician recommend you continue on your current medication as directed.   ° °*If you need a refill on your cardiac medications before your next appointment, please call your pharmacy* ° ° °Lab Work: °None ordered today ° ° °Testing/Procedures: °CT coronary calcium score.  ° °Test locations:  °[facility] ([address] Floor [place], [place] [address]) °[facility] ([address] [place], [place] [address])  ° °This is $99 out of pocket. ° ° °Coronary CalciumScan °A coronary calcium scan is an imaging test used to look for deposits of calcium and other fatty materials (plaques) in the inner lining of the blood vessels of the heart (coronary arteries). These deposits of calcium and plaques can partly clog and narrow the coronary arteries without producing any symptoms or warning signs. This puts a person at risk for a heart attack. This test can detect these deposits before symptoms develop. °Tell a health care provider about: °Any allergies you have. °All medicines you are taking, including vitamins, herbs, eye drops, creams, and over-the-counter medicines. °Any problems you or family members have had with anesthetic medicines. °Any blood disorders you have. °Any surgeries you have had. °Any medical conditions you have. °Whether you are pregnant or may be pregnant. °What are the risks? °Generally, this is a safe procedure. However, problems may occur, including: °Harm to a pregnant woman and her unborn baby. This test involves the use of radiation. Radiation exposure can be dangerous to a pregnant woman and her unborn baby. If you are pregnant, you generally should not have this procedure done. °Slight increase in the risk of cancer. This is because of the radiation involved in the test. °What happens before the procedure? °No preparation is needed for this procedure. °What happens during the procedure? °You will undress and remove any jewelry around your neck or  chest. °You will put on a hospital gown. °Sticky electrodes will be placed on your chest. The electrodes will be connected to an electrocardiogram (ECG) machine to record a tracing of the electrical activity of your heart. °A CT scanner will take pictures of your heart. During this time, you will be asked to lie still and hold your breath for 2-3 seconds while a picture of your heart is being taken. °The procedure may vary among health care providers and hospitals. °What happens after the procedure? °You can get dressed. °You can return to your normal activities. °It is up to you to get the results of your test. Ask your health care provider, or the department that is doing the test, when your results will be ready. °Summary °A coronary calcium scan is an imaging test used to look for deposits of calcium and other fatty materials (plaques) in the inner lining of the blood vessels of the heart (coronary arteries). °Generally, this is a safe procedure. Tell your health care provider if you are pregnant or may be pregnant. °No preparation is needed for this procedure. °A CT scanner will take pictures of your heart. °You can return to your normal activities after the scan is done. °This information is not intended to replace advice given to you by your health care provider. Make sure you discuss any questions you have with your health care provider. °Document Released: 10/06/2007 Document Revised: 02/27/2016 Document Reviewed: 02/27/2016 °Elsevier Interactive Patient Education © 2017 [employer] ° ° ° °Follow-Up: °At [facility], you and your health needs are our priority.  As part of our continuing mission to provide you with exceptional heart   care, we have created designated Provider Care Teams.  These Care Teams include your primary Cardiologist (physician) and Advanced Practice Providers (APPs -  Physician Assistants and Nurse Practitioners) who all work together to provide you with the care you need, when you  need it. ° °We recommend signing up for the patient portal called "MyChart".  Sign up information is provided on this After Visit Summary.  MyChart is used to connect with patients for Virtual Visits (Telemedicine).  Patients are able to view lab/test results, encounter notes, upcoming appointments, etc.  Non-urgent messages can be sent to your provider as well.   °To learn more about what you can do with MyChart, go to [email or web address].   ° °Your next appointment:   °1 year(s) ° °The format for your next appointment:   °In Person ° °Provider:   °Bridgette Christopher, MD  ° ° °Recent data, summarized from NPR from a study from the [facility]: ° °https://www.npr.org/sections/health-shots/2021/02/26/1134094540/statins-vs-supplements-new-study-finds-one-is-vastly-superior-to-cut-cholesterol ° °"Researchers at the [facility] set out to answer this question by comparing statins to supplements in a clinical trial. They tracked the outcomes of 190 adults, ages 40 to 75. Some participants were given a 5 mg daily dose of rosuvastatin, a statin that is sold under the brand name Crestor for 28 days. Others were given supplements, including fish oil, cinnamon, garlic, turmeric, plant sterols or red yeast rice for the same period." ° °"What we found was that rosuvastatin lowered LDL cholesterol by almost 38% and that was vastly superior to placebo and any of the six supplements studied in the trial," study author Luke Laffin, M.D. of the [facility] told NPR. He says this level of reduction is enough to lower the risk of heart attacks and strokes. The findings are published in the Journal of the American College of Cardiology. ° °"Oftentimes these supplements are marketed as 'natural ways' to lower your cholesterol," says Laffin. But he says none of the dietary supplements demonstrated any significant decrease in LDL cholesterol compared with a placebo. LDL  cholesterol is considered the 'bad cholesterol' because it can contribute to plaque build-up in the artery walls - which can narrow the arteries, and set the stage for heart attacks and strokes"  °

## 2021-07-31 ENCOUNTER — Encounter (HOSPITAL_BASED_OUTPATIENT_CLINIC_OR_DEPARTMENT_OTHER): Payer: Self-pay

## 2021-07-31 ENCOUNTER — Ambulatory Visit (HOSPITAL_BASED_OUTPATIENT_CLINIC_OR_DEPARTMENT_OTHER)
Admission: RE | Admit: 2021-07-31 | Discharge: 2021-07-31 | Disposition: A | Discharge status: Home / Self Care | Payer: No Typology Code available for payment source | Source: Ambulatory Visit | Attending: Cardiology | Admitting: Cardiology

## 2021-07-31 DIAGNOSIS — E7801 Familial hypercholesterolemia: Secondary | ICD-10-CM | POA: Insufficient documentation

## 2021-08-08 ENCOUNTER — Other Ambulatory Visit (HOSPITAL_BASED_OUTPATIENT_CLINIC_OR_DEPARTMENT_OTHER): Payer: Self-pay

## 2021-08-14 ENCOUNTER — Other Ambulatory Visit (HOSPITAL_BASED_OUTPATIENT_CLINIC_OR_DEPARTMENT_OTHER): Payer: Self-pay

## 2021-08-14 MED ORDER — ZOSTER VAC RECOMB ADJUVANTED 50 MCG/0.5ML IM SUSR
INTRAMUSCULAR | 0 refills | Status: DC
  Filled 2021-08-14: qty 0.5, 1d supply, fill #0

## 2021-08-15 ENCOUNTER — Other Ambulatory Visit (HOSPITAL_BASED_OUTPATIENT_CLINIC_OR_DEPARTMENT_OTHER): Payer: Self-pay

## 2021-08-17 ENCOUNTER — Encounter (HOSPITAL_BASED_OUTPATIENT_CLINIC_OR_DEPARTMENT_OTHER): Payer: Self-pay

## 2021-08-28 ENCOUNTER — Other Ambulatory Visit (HOSPITAL_BASED_OUTPATIENT_CLINIC_OR_DEPARTMENT_OTHER): Payer: Self-pay

## 2021-08-28 ENCOUNTER — Encounter (HOSPITAL_BASED_OUTPATIENT_CLINIC_OR_DEPARTMENT_OTHER): Payer: Self-pay

## 2021-08-28 ENCOUNTER — Other Ambulatory Visit (HOSPITAL_BASED_OUTPATIENT_CLINIC_OR_DEPARTMENT_OTHER): Payer: Self-pay | Admitting: Nurse Practitioner

## 2021-08-28 MED ORDER — ROSUVASTATIN CALCIUM 20 MG PO TABS
20.0000 mg | ORAL_TABLET | Freq: Every day | ORAL | 0 refills | Status: DC
  Filled 2021-08-28: qty 90, 90d supply, fill #0
  Filled 2021-08-31: qty 30, 30d supply, fill #0
  Filled 2021-10-02: qty 30, 30d supply, fill #1
  Filled 2021-10-30: qty 30, 30d supply, fill #2

## 2021-08-28 NOTE — Telephone Encounter (Signed)
Results released and follow up mychart message sent.  ?

## 2021-08-28 NOTE — Telephone Encounter (Signed)
Please advise 

## 2021-08-30 ENCOUNTER — Other Ambulatory Visit (HOSPITAL_BASED_OUTPATIENT_CLINIC_OR_DEPARTMENT_OTHER): Payer: Self-pay

## 2021-08-31 ENCOUNTER — Other Ambulatory Visit (HOSPITAL_BASED_OUTPATIENT_CLINIC_OR_DEPARTMENT_OTHER): Payer: Self-pay

## 2021-09-07 ENCOUNTER — Other Ambulatory Visit: Payer: No Typology Code available for payment source

## 2021-09-19 ENCOUNTER — Encounter (HOSPITAL_BASED_OUTPATIENT_CLINIC_OR_DEPARTMENT_OTHER): Payer: Self-pay | Admitting: Nurse Practitioner

## 2021-09-19 MED ORDER — FREESTYLE LIBRE 2 SENSOR MISC
99 refills | Status: DC
Start: 2021-09-19 — End: 2021-10-11
  Filled 2021-09-19: qty 2, 28d supply, fill #0
  Filled 2021-10-11: qty 2, 28d supply, fill #1

## 2021-09-20 ENCOUNTER — Other Ambulatory Visit (HOSPITAL_BASED_OUTPATIENT_CLINIC_OR_DEPARTMENT_OTHER): Payer: Self-pay

## 2021-09-20 ENCOUNTER — Other Ambulatory Visit (HOSPITAL_BASED_OUTPATIENT_CLINIC_OR_DEPARTMENT_OTHER): Payer: Self-pay | Admitting: Nurse Practitioner

## 2021-09-20 MED ORDER — FREESTYLE LIBRE 2 READER DEVI
0 refills | Status: DC
  Filled 2021-09-20: qty 1, 30d supply, fill #0
  Filled 2021-09-21: qty 1, 1d supply, fill #0

## 2021-09-21 ENCOUNTER — Other Ambulatory Visit (HOSPITAL_BASED_OUTPATIENT_CLINIC_OR_DEPARTMENT_OTHER): Payer: Self-pay

## 2021-10-02 ENCOUNTER — Other Ambulatory Visit (HOSPITAL_BASED_OUTPATIENT_CLINIC_OR_DEPARTMENT_OTHER): Payer: Self-pay

## 2021-10-11 ENCOUNTER — Other Ambulatory Visit (HOSPITAL_BASED_OUTPATIENT_CLINIC_OR_DEPARTMENT_OTHER): Payer: Self-pay | Admitting: Nurse Practitioner

## 2021-10-11 ENCOUNTER — Other Ambulatory Visit (HOSPITAL_BASED_OUTPATIENT_CLINIC_OR_DEPARTMENT_OTHER): Payer: Self-pay

## 2021-10-12 ENCOUNTER — Other Ambulatory Visit (HOSPITAL_BASED_OUTPATIENT_CLINIC_OR_DEPARTMENT_OTHER): Payer: Self-pay

## 2021-10-23 ENCOUNTER — Encounter (HOSPITAL_BASED_OUTPATIENT_CLINIC_OR_DEPARTMENT_OTHER): Payer: No Typology Code available for payment source | Admitting: Nurse Practitioner

## 2021-10-25 ENCOUNTER — Encounter (HOSPITAL_BASED_OUTPATIENT_CLINIC_OR_DEPARTMENT_OTHER): Payer: No Typology Code available for payment source | Admitting: Nurse Practitioner

## 2021-10-30 ENCOUNTER — Other Ambulatory Visit (HOSPITAL_BASED_OUTPATIENT_CLINIC_OR_DEPARTMENT_OTHER): Payer: Self-pay

## 2021-11-27 ENCOUNTER — Other Ambulatory Visit (HOSPITAL_BASED_OUTPATIENT_CLINIC_OR_DEPARTMENT_OTHER): Payer: Self-pay | Admitting: Nurse Practitioner

## 2021-11-27 ENCOUNTER — Other Ambulatory Visit (HOSPITAL_BASED_OUTPATIENT_CLINIC_OR_DEPARTMENT_OTHER): Payer: Self-pay

## 2021-11-27 MED ORDER — ROSUVASTATIN CALCIUM 20 MG PO TABS
20.0000 mg | ORAL_TABLET | Freq: Every day | ORAL | 0 refills | Status: DC
Start: 2021-11-27 — End: 2022-02-19
  Filled 2021-11-27: qty 30, 30d supply, fill #0
  Filled 2021-12-27: qty 30, 30d supply, fill #1
  Filled 2022-01-22: qty 30, 30d supply, fill #2

## 2021-12-24 ENCOUNTER — Encounter (HOSPITAL_BASED_OUTPATIENT_CLINIC_OR_DEPARTMENT_OTHER): Payer: Self-pay | Admitting: Nurse Practitioner

## 2021-12-27 ENCOUNTER — Other Ambulatory Visit (HOSPITAL_BASED_OUTPATIENT_CLINIC_OR_DEPARTMENT_OTHER): Payer: Self-pay

## 2022-01-01 ENCOUNTER — Encounter (HOSPITAL_BASED_OUTPATIENT_CLINIC_OR_DEPARTMENT_OTHER): Payer: No Typology Code available for payment source | Admitting: Nurse Practitioner

## 2022-01-18 ENCOUNTER — Encounter (HOSPITAL_BASED_OUTPATIENT_CLINIC_OR_DEPARTMENT_OTHER): Payer: Self-pay | Admitting: Nurse Practitioner

## 2022-01-18 ENCOUNTER — Other Ambulatory Visit (HOSPITAL_BASED_OUTPATIENT_CLINIC_OR_DEPARTMENT_OTHER): Payer: Self-pay | Admitting: Nurse Practitioner

## 2022-01-18 ENCOUNTER — Ambulatory Visit (INDEPENDENT_AMBULATORY_CARE_PROVIDER_SITE_OTHER): Payer: No Typology Code available for payment source | Admitting: Nurse Practitioner

## 2022-01-18 VITALS — BP 115/70 | HR 64 | Ht 62.0 in | Wt 204.8 lb

## 2022-01-18 DIAGNOSIS — R7989 Other specified abnormal findings of blood chemistry: Secondary | ICD-10-CM

## 2022-01-18 DIAGNOSIS — Z124 Encounter for screening for malignant neoplasm of cervix: Secondary | ICD-10-CM

## 2022-01-18 DIAGNOSIS — Z23 Encounter for immunization: Secondary | ICD-10-CM | POA: Diagnosis not present

## 2022-01-18 DIAGNOSIS — Z1231 Encounter for screening mammogram for malignant neoplasm of breast: Secondary | ICD-10-CM

## 2022-01-18 DIAGNOSIS — Z0000 Encounter for general adult medical examination without abnormal findings: Secondary | ICD-10-CM

## 2022-01-18 NOTE — Progress Notes (Signed)
BP 115/70   Pulse 64   Ht _0  (1.575 m)   Wt 204 lb 12.8 oz (92.9 kg)   SpO2 96%   BMI 37.46 kg/m    Subjective:    Patient ID: Carrie Garrison, female    DOB: 06-Aug-1960, 61 y.o.   MRN: [id number]  HPI: Carrie Garrison is a 61 y.o. female presenting on 01/18/2022 for comprehensive medical examination.   Current medical concerns include:none  She reports regular vision exams q1-5y: yes She reports regular dental exams q 37m yes Her diet consists of:  mix- no specific restrictions She endorses exercise and/or activity of:  none She works in:  office setting  She denies excessive ETOH use  She denies nictoine use  She denies illegal substance use   She is not having menstrual periods.  She  denies abnormal bleeding. She  denies menopausal symptoms. She denies concerns today about STI: testing ordered: no  She denies concerns about skin changes today  She denies concerns about bowel changes today  She denies concerns about bladder changes today   Most Recent Depression Screen:     06/02/2021    8:20 AM  Depression screen PHQ 2/9  Decreased Interest 0  Down, Depressed, Hopeless 0  PHQ - 2 Score 0   Most Recent Anxiety Screen:      No data to display         Most Recent Fall Screen:    06/02/2021    8:21 AM  Fall Risk   Falls in the past year? 0  Number falls in past yr: 0  Injury with Fall? 0  Risk for fall due to : No Fall Risks  Follow up Falls evaluation completed    All ROS negative except what is listed above and in the HPI.   Past medical history, surgical history, medications, allergies, family history and social history reviewed with patient today and changes made to appropriate areas of the chart.  Past Medical History:  History reviewed. No pertinent past medical history. Medications:  Current Outpatient Medications on File Prior to Visit  Medication Sig   rosuvastatin (CRESTOR) 20 MG tablet Take 1 tablet (20 mg total) by mouth at bedtime.    sertraline (ZOLOFT) 50 MG tablet Take 1.5 tablets (75 mg total) by mouth daily.   No current facility-administered medications on file prior to visit.   Surgical History:  History reviewed. No pertinent surgical history. Allergies:  Allergies  Allergen Reactions   Bactrim [Sulfamethoxazole-Trimethoprim] Rash    Drug eruption   Social History:  Social History   Socioeconomic History   Marital status: Single    Spouse name: Not on file   Number of children: Not on file   Years of education: Not on file   Highest education level: Not on file  Occupational History   Not on file  Tobacco Use   Smoking status: Never   Smokeless tobacco: Never  Substance and Sexual Activity   Alcohol use: Not Currently   Drug use: Not Currently   Sexual activity: Not Currently  Other Topics Concern   Not on file  Social History Narrative   Not on file   Social Determinants of Health   Financial Resource Strain: Not on file  Food Insecurity: Not on file  Transportation Needs: Not on file  Physical Activity: Not on file  Stress: Not on file  Social Connections: Not on file  Intimate Partner Violence: Not on file   Social History  Tobacco Use  Smoking Status Never  Smokeless Tobacco Never   Social History   Substance and Sexual Activity  Alcohol Use Not Currently   Family History:  History reviewed. No pertinent family history.     Objective:    BP 115/70   Pulse 64   Ht _0  (1.575 m)   Wt 204 lb 12.8 oz (92.9 kg)   SpO2 96%   BMI 37.46 kg/m   Wt Readings from Last 3 Encounters:  01/18/22 204 lb 12.8 oz (92.9 kg)  07/17/21 206 lb 1.6 oz (93.5 kg)  06/06/21 195 lb (88.5 kg)    Physical Exam Vitals and nursing note reviewed.  Constitutional:      General: She is not in acute distress.    Appearance: Normal appearance. She is obese.  HENT:     Head: Normocephalic and atraumatic.     Right Ear: Hearing, tympanic membrane, ear canal and external ear normal.      Left Ear: Hearing, tympanic membrane, ear canal and external ear normal.     Nose: Nose normal.     Right Sinus: No maxillary sinus tenderness or frontal sinus tenderness.     Left Sinus: No maxillary sinus tenderness or frontal sinus tenderness.     Mouth/Throat:     Lips: Pink.     Mouth: Mucous membranes are moist.     Pharynx: Oropharynx is clear.  Eyes:     General: Lids are normal. Vision grossly intact.     Extraocular Movements: Extraocular movements intact.     Conjunctiva/sclera: Conjunctivae normal.     Pupils: Pupils are equal, round, and reactive to light.     Funduscopic exam:    Right eye: Red reflex present.        Left eye: Red reflex present.    Visual Fields: Right eye visual fields normal and left eye visual fields normal.  Neck:     Thyroid: No thyromegaly.     Vascular: No carotid bruit.  Cardiovascular:     Rate and Rhythm: Normal rate and regular rhythm.     Chest Wall: PMI is not displaced.     Pulses: Normal pulses.          Dorsalis pedis pulses are 2+ on the right side and 2+ on the left side.       Posterior tibial pulses are 2+ on the right side and 2+ on the left side.     Heart sounds: Normal heart sounds. No murmur heard. Pulmonary:     Effort: Pulmonary effort is normal. No respiratory distress.     Breath sounds: Normal breath sounds.  Abdominal:     General: Abdomen is flat. Bowel sounds are normal. There is no distension.     Palpations: Abdomen is soft. There is no hepatomegaly, splenomegaly or mass.     Tenderness: There is no abdominal tenderness. There is no right CVA tenderness, left CVA tenderness, guarding or rebound.  Musculoskeletal:        General: Normal range of motion.     Cervical back: Full passive range of motion without pain, normal range of motion and neck supple. No tenderness.     Right lower leg: No edema.     Left lower leg: No edema.  Feet:     Left foot:     Toenail Condition: Left toenails are normal.   Lymphadenopathy:     Cervical: No cervical adenopathy.     Upper Body:  Right upper body: No supraclavicular adenopathy.     Left upper body: No supraclavicular adenopathy.  Skin:    General: Skin is warm and dry.     Capillary Refill: Capillary refill takes less than 2 seconds.     Nails: There is no clubbing.  Neurological:     General: No focal deficit present.     Mental Status: She is alert and oriented to person, place, and time.     GCS: GCS eye subscore is 4. GCS verbal subscore is 5. GCS motor subscore is 6.     Sensory: Sensation is intact.     Motor: Motor function is intact.     Coordination: Coordination is intact.     Gait: Gait is intact.     Deep Tendon Reflexes: Reflexes are normal and symmetric.  Psychiatric:        Attention and Perception: Attention normal.        Mood and Affect: Mood normal.        Speech: Speech normal.        Behavior: Behavior normal. Behavior is cooperative.        Thought Content: Thought content normal.        Cognition and Memory: Cognition and memory normal.        Judgment: Judgment normal.     Results for orders placed or performed in visit on 01/18/22  CBC with Differential/Platelet  Result Value Ref Range   WBC 7.1 3.4 - 10.8 x10E3/uL   RBC 4.80 3.77 - 5.28 x10E6/uL   Hemoglobin 13.8 11.1 - 15.9 g/dL   Hematocrit 41.6 34.0 - 46.6 %   MCV 87 79 - 97 fL   MCH 28.8 26.6 - 33.0 pg   MCHC 33.2 31.5 - 35.7 g/dL   RDW 12.8 11.7 - 15.4 %   Platelets 233 150 - 450 x10E3/uL   Neutrophils 69 Not Estab. %   Lymphs 22 Not Estab. %   Monocytes 7 Not Estab. %   Eos 1 Not Estab. %   Basos 1 Not Estab. %   Neutrophils Absolute 5.0 1.4 - 7.0 x10E3/uL   Lymphocytes Absolute 1.5 0.7 - 3.1 x10E3/uL   Monocytes Absolute 0.5 0.1 - 0.9 x10E3/uL   EOS (ABSOLUTE) 0.1 0.0 - 0.4 x10E3/uL   Basophils Absolute 0.0 0.0 - 0.2 x10E3/uL   Immature Granulocytes 0 Not Estab. %   Immature Grans (Abs) 0.0 0.0 - 0.1 x10E3/uL  Comprehensive  metabolic panel  Result Value Ref Range   Glucose 80 70 - 99 mg/dL   BUN 22 8 - 27 mg/dL   Creatinine, Ser 1.01 (H) 0.57 - 1.00 mg/dL   eGFR 63 >59 mL/min/1.73   BUN/Creatinine Ratio 22 12 - 28   Sodium 140 134 - 144 mmol/L   Potassium 4.5 3.5 - 5.2 mmol/L   Chloride 103 96 - 106 mmol/L   CO2 24 20 - 29 mmol/L   Calcium 9.1 8.7 - 10.3 mg/dL   Total Protein 6.6 6.0 - 8.5 g/dL   Albumin 4.5 3.9 - 4.9 g/dL   Globulin, Total 2.1 1.5 - 4.5 g/dL   Albumin/Globulin Ratio 2.1 1.2 - 2.2   Bilirubin Total 0.3 0.0 - 1.2 mg/dL   Alkaline Phosphatase 105 44 - 121 IU/L   AST 25 0 - 40 IU/L   ALT 22 0 - 32 IU/L  Hemoglobin A1c  Result Value Ref Range   Hgb A1c MFr Bld 5.5 4.8 - 5.6 %   Est. average  glucose Bld gHb Est-mCnc 111 mg/dL  LP+LDL Direct  Result Value Ref Range   Cholesterol, Total 214 (H) 100 - 199 mg/dL   Triglycerides 110 0 - 149 mg/dL   HDL 54 >39 mg/dL   VLDL Cholesterol Cal 20 5 - 40 mg/dL   LDL Chol Calc (NIH) 140 (H) 0 - 99 mg/dL   LDL Direct 156 (H) 0 - 99 mg/dL  TSH  Result Value Ref Range   TSH 2.510 0.450 - 4.500 uIU/mL  T4, free  Result Value Ref Range   Free T4 1.22 0.82 - 1.77 ng/dL  VITAMIN D 25 Hydroxy (Vit-D Deficiency, Fractures)  Result Value Ref Range   Vit D, 25-Hydroxy 53.8 30.0 - 100.0 ng/mL    MMUNIZATIONS:   - Tdap: Tetanus vaccination status reviewed: tetanus status unknown to the patient. - Influenza: Administered today - Pneumovax: Not applicable - Prevnar: Not applicable - HPV: Not applicable - Zostavax vaccine:  UTD  SCREENING COMPLETED: - Pap smear: Not Applicable - STI testing:Declined -Mammogram: Ordered today - Colonoscopy: { utd - Bone Density: {Not Applicable -Hearing Test: Not Applicable -Spirometry: Not Applicable     Assessment & Plan:   Problem List Items Addressed This Visit   None Visit Diagnoses     Encounter for annual physical exam    -  Primary   Relevant Orders   CBC with Differential/Platelet (Completed)    Comprehensive metabolic panel (Completed)   Hemoglobin A1c (Completed)   LP+LDL Direct (Completed)   TSH (Completed)   T4, free (Completed)   VITAMIN D 25 Hydroxy (Vit-D Deficiency, Fractures) (Completed)   Health care maintenance       Relevant Orders   MM 3D SCREEN BREAST BILATERAL   Cervical cancer screening       Relevant Orders   IGP,Aptima HPV,CtNg Age Gdln   Flu vaccine need       Relevant Orders   Flu Vaccine QUAD 6+ mos PF IM (Fluarix Quad PF) (Completed)          Follow up plan: Return in about 1 year (around 01/19/2023) for CPE .  NEXT PREVENTATIVE PHYSICAL DUE IN 1 YEAR.  PATIENT COUNSELING PROVIDED FOR ALL ADULT PATIENTS:  Consume a well balanced diet low in saturated fats, cholesterol, and moderation in carbohydrates.   This can be as simple as monitoring portion sizes and cutting back on sugary beverages such as soda and juice to start with.    Daily water consumption of at least 64 ounces.  Physical activity at least 180 minutes per week, if just starting out.   This can be as simple as taking the stairs instead of the elevator and walking 2-3 laps around the office  purposefully every day.   STD protection, partner selection, and regular testing if high risk.  Limited consumption of alcoholic beverages if alcohol is consumed.  For women, I recommend no more than 7 alcoholic beverages per week, spread out throughout the week.  Avoid "binge" drinking or consuming large quantities of alcohol in one setting.   Please let me know if you feel you may need help with reduction or quitting alcohol consumption.   Avoidance of nicotine, if used.  Please let me know if you feel you may need help with reduction or quitting nicotine use.   Daily mental health attention.  This can be in the form of 5 minute daily meditation, prayer, journaling, yoga, reflection, etc.   Purposeful attention to your emotions and mental state can significantly  improve your overall  wellbeing and Health.  Please know that I am here to help you with all of your health care goals and am happy to work with you to find a solution that works best for you.  The greatest advice I have received with any changes in life are to take it one step at a time, that even means if all you can focus on is the next 60 seconds, then do that and celebrate your victories.  With any changes in life, you will have set backs, and that is OK. The important thing to remember is, if you have a set back, it is not a failure, it is an opportunity to try again!  Health Maintenance Recommendations Screening Testing Mammogram Every 1 -2 years based on history and risk factors Starting at age 48 Pap Smear Ages 21-39 every 3 years Ages 84-65 every 5 years with HPV testing More frequent testing may be required based on results and history Colon Cancer Screening Every 1-10 years based on test performed, risk factors, and history Starting at age 37 Bone Density Screening Every 2-10 years based on history Starting at age 57 for women Recommendations for men differ based on medication usage, history, and risk factors AAA Screening One time ultrasound Men 1-41 years old who have every smoked Lung Cancer Screening Low Dose Lung CT every 12 months Age 70-80 years with a 30 pack-year smoking history who still smoke or who have quit within the last 15 years  Screening Labs Routine  Labs: Complete Blood Count (CBC), Complete Metabolic Panel (CMP), Cholesterol (Lipid Panel) Every 6-12 months based on history and medications May be recommended more frequently based on current conditions or previous results Hemoglobin A1c Lab Every 3-12 months based on history and previous results Starting at age 25 or earlier with diagnosis of diabetes, high cholesterol, BMI >26, and/or risk factors Frequent monitoring for patients with diabetes to ensure blood sugar control Thyroid Panel (TSH w/ T3 & T4) Every 6 months  based on history, symptoms, and risk factors May be repeated more often if on medication HIV One time testing for all patients 23 and older May be repeated more frequently for patients with increased risk factors or exposure Hepatitis C One time testing for all patients 78 and older May be repeated more frequently for patients with increased risk factors or exposure Gonorrhea, Chlamydia Every 12 months for all sexually active persons 13-24 years Additional monitoring may be recommended for those who are considered high risk or who have symptoms PSA Men 51-77 years old with risk factors Additional screening may be recommended from age 6-69 based on risk factors, symptoms, and history  Vaccine Recommendations Tetanus Booster All adults every 10 years Flu Vaccine All patients 6 months and older every year COVID Vaccine All patients 12 years and older Initial dosing with booster May recommend additional booster based on age and health history HPV Vaccine 2 doses all patients age 37-26 Dosing may be considered for patients over 26 Shingles Vaccine (Shingrix) 2 doses all adults 7 years and older Pneumonia (Pneumovax 23) All adults 14 years and older May recommend earlier dosing based on health history Pneumonia (Prevnar 57) All adults 6 years and older Dosed 1 year after Pneumovax 23  Additional Screening, Testing, and Vaccinations may be recommended on an individualized basis based on family history, health history, risk factors, and/or exposure.

## 2022-01-18 NOTE — Patient Instructions (Addendum)
It was a pleasure seeing you today. I hope your time spent with [place] was pleasant and helpful. Please let us know if there is anything we can do to improve the service you receive.   You are looking great today!! I will let you know about your labs as soon as I have a chance to review them.    Important Office Information Lab Results If labs were ordered, please note that you will see results through [place] as soon as they come available from [place].  It takes up to 5 business days for the results to be routed to me and for me to review them once all of the lab results have come through from [facility]. I will make recommendations based on your results and send these through [place] or someone from the office will call you to discuss. If your labs are abnormal, we may contact you to schedule a visit to discuss the results and make recommendations.  If you have not heard from [place] within 5 business days or you have waited longer than a week and your lab results have not come through on [place], please feel free to call the office or send a message through [place] to follow-up on these labs.   Referrals If referrals were placed today, the office where the referral was sent will contact you either by phone or through [place] to set up scheduling. Please note that it can take up to a week for the referral office to contact you. If you do not hear from them in a week, please contact the referral office directly to inquire about scheduling.   Condition Treated If your condition worsens or you begin to have new symptoms, please schedule a follow-up appointment for further evaluation. If you are not sure if an appointment is needed, you may call the office to leave a message for the nurse and someone will contact you with recommendations.  If you have an urgent or life threatening emergency, please do not call the office, but seek emergency evaluation by calling 911 or going to the nearest emergency room for  evaluation.   MyChart and Phone Calls Please do not use MyChart for urgent messages. It may take up to 3 business days for MyChart messages to be read by staff and if they are unable to handle the request, an additional 3 business days for them to be routed to me and for my response.  Messages sent to the provider through [place] do not come directly to the provider, please allow time for these messages to be routed and for me to respond.  We get a large volume of MyChart messages daily and these are responded to in the order received.   For urgent messages, please call the office at [phone] and speak with the front office staff or leave a message on the line of my assistant for guidance.  We are seeing patients from the hours of 8:00 am through 5:00 pm and calls directly to the nurse may not be answered immediately due to seeing patients, but your call will be returned as soon as possible.  Phone  messages received after 4:00 PM Monday through Thursday may not be returned until the following business day. Phone messages received after 11:00 AM on Friday may not be returned until Monday.   After Hours We share on call hours with providers from other offices. If you have an urgent need after hours that cannot wait until the next business day, please  contact the on call provider by calling the office number. A nurse will speak with you and contact the provider if needed for recommendations.  If you have an urgent or life threatening emergency after hours, please do not call the on call provider, but seek emergency evaluation by calling 911 or going to the nearest emergency room for evaluation.   Paperwork All paperwork requires a minimum of 5 days to complete and return to you or the designated personnel. Please keep this in mind when bringing in forms or sending requests for paperwork completion to the office.

## 2022-01-19 DIAGNOSIS — Z23 Encounter for immunization: Secondary | ICD-10-CM

## 2022-01-19 LAB — CBC WITH DIFFERENTIAL/PLATELET
Basophils Absolute: 0 10*3/uL (ref 0.0–0.2)
Basos: 1 %
EOS (ABSOLUTE): 0.1 10*3/uL (ref 0.0–0.4)
Eos: 1 %
Hematocrit: 41.6 % (ref 34.0–46.6)
Hemoglobin: 13.8 g/dL (ref 11.1–15.9)
Immature Grans (Abs): 0 10*3/uL (ref 0.0–0.1)
Immature Granulocytes: 0 %
Lymphocytes Absolute: 1.5 10*3/uL (ref 0.7–3.1)
Lymphs: 22 %
MCH: 28.8 pg (ref 26.6–33.0)
MCHC: 33.2 g/dL (ref 31.5–35.7)
MCV: 87 fL (ref 79–97)
Monocytes Absolute: 0.5 10*3/uL (ref 0.1–0.9)
Monocytes: 7 %
Neutrophils Absolute: 5 10*3/uL (ref 1.4–7.0)
Neutrophils: 69 %
Platelets: 233 10*3/uL (ref 150–450)
RBC: 4.8 x10E6/uL (ref 3.77–5.28)
RDW: 12.8 % (ref 11.7–15.4)
WBC: 7.1 10*3/uL (ref 3.4–10.8)

## 2022-01-19 LAB — COMPREHENSIVE METABOLIC PANEL
ALT: 22 IU/L (ref 0–32)
AST: 25 IU/L (ref 0–40)
Albumin/Globulin Ratio: 2.1 (ref 1.2–2.2)
Albumin: 4.5 g/dL (ref 3.9–4.9)
Alkaline Phosphatase: 105 IU/L (ref 44–121)
BUN/Creatinine Ratio: 22 (ref 12–28)
BUN: 22 mg/dL (ref 8–27)
Bilirubin Total: 0.3 mg/dL (ref 0.0–1.2)
CO2: 24 mmol/L (ref 20–29)
Calcium: 9.1 mg/dL (ref 8.7–10.3)
Chloride: 103 mmol/L (ref 96–106)
Creatinine, Ser: 1.01 mg/dL — ABNORMAL HIGH (ref 0.57–1.00)
Globulin, Total: 2.1 g/dL (ref 1.5–4.5)
Glucose: 80 mg/dL (ref 70–99)
Potassium: 4.5 mmol/L (ref 3.5–5.2)
Sodium: 140 mmol/L (ref 134–144)
Total Protein: 6.6 g/dL (ref 6.0–8.5)
eGFR: 63 mL/min/{1.73_m2} (ref >59)

## 2022-01-19 LAB — LP+LDL DIRECT
Cholesterol, Total: 214 mg/dL — ABNORMAL HIGH (ref 100–199)
HDL: 54 mg/dL (ref >39)
LDL Chol Calc (NIH): 140 mg/dL — ABNORMAL HIGH (ref 0–99)
LDL Direct: 156 mg/dL — ABNORMAL HIGH (ref 0–99)
Triglycerides: 110 mg/dL (ref 0–149)
VLDL Cholesterol Cal: 20 mg/dL (ref 5–40)

## 2022-01-19 LAB — TSH: TSH: 2.51 u[IU]/mL (ref 0.450–4.500)

## 2022-01-19 LAB — HEMOGLOBIN A1C
Est. average glucose Bld gHb Est-mCnc: 111 mg/dL
Hgb A1c MFr Bld: 5.5 % (ref 4.8–5.6)

## 2022-01-19 LAB — VITAMIN D 25 HYDROXY (VIT D DEFICIENCY, FRACTURES): Vit D, 25-Hydroxy: 53.8 ng/mL (ref 30.0–100.0)

## 2022-01-19 LAB — T4, FREE: Free T4: 1.22 ng/dL (ref 0.82–1.77)

## 2022-01-20 ENCOUNTER — Ambulatory Visit (HOSPITAL_BASED_OUTPATIENT_CLINIC_OR_DEPARTMENT_OTHER): Payer: No Typology Code available for payment source | Admitting: Radiology

## 2022-01-22 ENCOUNTER — Other Ambulatory Visit (HOSPITAL_BASED_OUTPATIENT_CLINIC_OR_DEPARTMENT_OTHER): Payer: Self-pay

## 2022-01-22 ENCOUNTER — Other Ambulatory Visit (HOSPITAL_BASED_OUTPATIENT_CLINIC_OR_DEPARTMENT_OTHER): Payer: Self-pay | Admitting: Nurse Practitioner

## 2022-01-22 DIAGNOSIS — R4584 Anhedonia: Secondary | ICD-10-CM

## 2022-01-22 MED ORDER — SERTRALINE HCL 100 MG PO TABS
150.0000 mg | ORAL_TABLET | Freq: Every day | ORAL | 3 refills | Status: DC
  Filled 2022-01-22: qty 45, 30d supply, fill #0
  Filled 2022-02-17: qty 45, 30d supply, fill #1
  Filled 2022-03-19: qty 45, 30d supply, fill #2

## 2022-01-24 LAB — IGP, APTIMA HPV, RFX 16/18,45
HPV Aptima: NEGATIVE
PAP Smear Comment: 0

## 2022-01-24 LAB — IGP,APTIMA HPV,CTNG AGE GDLN

## 2022-01-27 ENCOUNTER — Ambulatory Visit (HOSPITAL_BASED_OUTPATIENT_CLINIC_OR_DEPARTMENT_OTHER)
Admission: RE | Admit: 2022-01-27 | Discharge: 2022-01-27 | Disposition: A | Discharge status: Home / Self Care | Payer: No Typology Code available for payment source | Source: Ambulatory Visit | Attending: Nurse Practitioner | Admitting: Nurse Practitioner

## 2022-01-27 DIAGNOSIS — Z1231 Encounter for screening mammogram for malignant neoplasm of breast: Secondary | ICD-10-CM | POA: Diagnosis present

## 2022-01-30 ENCOUNTER — Encounter (HOSPITAL_BASED_OUTPATIENT_CLINIC_OR_DEPARTMENT_OTHER): Payer: Self-pay | Admitting: Nurse Practitioner

## 2022-01-30 NOTE — Addendum Note (Signed)
Addended by: Alegandro Macnaughton, Clarise Cruz E on: 01/30/2022 07:16 AM   Modules accepted: Orders

## 2022-02-08 ENCOUNTER — Other Ambulatory Visit (HOSPITAL_BASED_OUTPATIENT_CLINIC_OR_DEPARTMENT_OTHER): Payer: Self-pay

## 2022-02-08 MED ORDER — COMIRNATY 30 MCG/0.3ML IM SUSY
PREFILLED_SYRINGE | INTRAMUSCULAR | 0 refills | Status: DC
  Filled 2022-02-08: qty 0.3, 1d supply, fill #0
  Filled 2022-02-09: qty 0.3, fill #0

## 2022-02-09 ENCOUNTER — Other Ambulatory Visit (HOSPITAL_BASED_OUTPATIENT_CLINIC_OR_DEPARTMENT_OTHER): Payer: Self-pay

## 2022-02-17 ENCOUNTER — Other Ambulatory Visit (HOSPITAL_BASED_OUTPATIENT_CLINIC_OR_DEPARTMENT_OTHER): Payer: Self-pay

## 2022-02-19 ENCOUNTER — Other Ambulatory Visit (HOSPITAL_BASED_OUTPATIENT_CLINIC_OR_DEPARTMENT_OTHER): Payer: Self-pay | Admitting: Nurse Practitioner

## 2022-02-19 ENCOUNTER — Other Ambulatory Visit (HOSPITAL_BASED_OUTPATIENT_CLINIC_OR_DEPARTMENT_OTHER): Payer: Self-pay

## 2022-02-19 MED ORDER — ROSUVASTATIN CALCIUM 20 MG PO TABS
20.0000 mg | ORAL_TABLET | Freq: Every day | ORAL | 0 refills | Status: DC
  Filled 2022-02-19: qty 30, 30d supply, fill #0
  Filled 2022-03-20: qty 30, 30d supply, fill #1
  Filled 2022-04-16: qty 30, 30d supply, fill #2

## 2022-03-19 ENCOUNTER — Other Ambulatory Visit (HOSPITAL_BASED_OUTPATIENT_CLINIC_OR_DEPARTMENT_OTHER): Payer: Self-pay

## 2022-03-20 ENCOUNTER — Other Ambulatory Visit (HOSPITAL_BASED_OUTPATIENT_CLINIC_OR_DEPARTMENT_OTHER): Payer: Self-pay

## 2022-04-04 ENCOUNTER — Encounter: Payer: Self-pay | Admitting: Nurse Practitioner

## 2022-04-05 ENCOUNTER — Other Ambulatory Visit: Payer: Self-pay | Admitting: Nurse Practitioner

## 2022-04-05 ENCOUNTER — Other Ambulatory Visit (HOSPITAL_BASED_OUTPATIENT_CLINIC_OR_DEPARTMENT_OTHER): Payer: Self-pay

## 2022-04-05 DIAGNOSIS — R4584 Anhedonia: Secondary | ICD-10-CM

## 2022-04-05 MED ORDER — SERTRALINE HCL 100 MG PO TABS
200.0000 mg | ORAL_TABLET | Freq: Every day | ORAL | 3 refills | Status: DC
  Filled 2022-04-05: qty 60, 30d supply, fill #0
  Filled 2022-04-30 (×2): qty 60, 30d supply, fill #1
  Filled 2022-05-29: qty 60, 30d supply, fill #2
  Filled 2022-06-28 – 2022-07-10 (×4): qty 60, 30d supply, fill #3
  Filled 2022-08-02: qty 60, 30d supply, fill #4

## 2022-04-30 ENCOUNTER — Other Ambulatory Visit (HOSPITAL_BASED_OUTPATIENT_CLINIC_OR_DEPARTMENT_OTHER): Payer: Self-pay

## 2022-04-30 ENCOUNTER — Other Ambulatory Visit: Payer: Self-pay

## 2022-05-16 ENCOUNTER — Other Ambulatory Visit (HOSPITAL_BASED_OUTPATIENT_CLINIC_OR_DEPARTMENT_OTHER): Payer: Self-pay | Admitting: Nurse Practitioner

## 2022-05-16 ENCOUNTER — Other Ambulatory Visit (HOSPITAL_BASED_OUTPATIENT_CLINIC_OR_DEPARTMENT_OTHER): Payer: Self-pay

## 2022-05-16 MED ORDER — ROSUVASTATIN CALCIUM 20 MG PO TABS
20.0000 mg | ORAL_TABLET | Freq: Every day | ORAL | 0 refills | Status: DC
  Filled 2022-05-16: qty 30, 30d supply, fill #0
  Filled 2022-06-14: qty 30, 30d supply, fill #1
  Filled 2022-07-10: qty 30, 30d supply, fill #2

## 2022-06-29 ENCOUNTER — Other Ambulatory Visit (HOSPITAL_BASED_OUTPATIENT_CLINIC_OR_DEPARTMENT_OTHER): Payer: Self-pay

## 2022-06-30 ENCOUNTER — Other Ambulatory Visit (HOSPITAL_BASED_OUTPATIENT_CLINIC_OR_DEPARTMENT_OTHER): Payer: Self-pay

## 2022-07-09 ENCOUNTER — Other Ambulatory Visit (HOSPITAL_BASED_OUTPATIENT_CLINIC_OR_DEPARTMENT_OTHER): Payer: Self-pay

## 2022-07-10 ENCOUNTER — Other Ambulatory Visit (HOSPITAL_BASED_OUTPATIENT_CLINIC_OR_DEPARTMENT_OTHER): Payer: Self-pay

## 2022-08-01 ENCOUNTER — Telehealth (HOSPITAL_BASED_OUTPATIENT_CLINIC_OR_DEPARTMENT_OTHER): Payer: Self-pay | Admitting: Cardiology

## 2022-08-01 NOTE — Telephone Encounter (Signed)
Spoke with patient to schedule her yearly follow up with Dr. Henrietta Hoover states she does not need to be seen.  Will delete recall

## 2022-08-03 ENCOUNTER — Other Ambulatory Visit (HOSPITAL_BASED_OUTPATIENT_CLINIC_OR_DEPARTMENT_OTHER): Payer: Self-pay | Admitting: Nurse Practitioner

## 2022-08-03 ENCOUNTER — Other Ambulatory Visit (HOSPITAL_BASED_OUTPATIENT_CLINIC_OR_DEPARTMENT_OTHER): Payer: Self-pay

## 2022-08-03 MED ORDER — ROSUVASTATIN CALCIUM 20 MG PO TABS
20.0000 mg | ORAL_TABLET | Freq: Every day | ORAL | 0 refills | Status: DC
  Filled 2022-08-03 – 2022-08-04 (×2): qty 30, 30d supply, fill #0

## 2022-08-04 ENCOUNTER — Other Ambulatory Visit (HOSPITAL_BASED_OUTPATIENT_CLINIC_OR_DEPARTMENT_OTHER): Payer: Self-pay

## 2022-08-28 ENCOUNTER — Other Ambulatory Visit (HOSPITAL_BASED_OUTPATIENT_CLINIC_OR_DEPARTMENT_OTHER): Payer: Self-pay

## 2022-08-28 ENCOUNTER — Ambulatory Visit: Payer: No Typology Code available for payment source | Admitting: Nurse Practitioner

## 2022-08-28 ENCOUNTER — Encounter: Payer: Self-pay | Admitting: Nurse Practitioner

## 2022-08-28 VITALS — BP 118/74 | HR 68 | Wt 207.0 lb

## 2022-08-28 DIAGNOSIS — Z1321 Encounter for screening for nutritional disorder: Secondary | ICD-10-CM

## 2022-08-28 DIAGNOSIS — R4584 Anhedonia: Secondary | ICD-10-CM

## 2022-08-28 DIAGNOSIS — R638 Other symptoms and signs concerning food and fluid intake: Secondary | ICD-10-CM

## 2022-08-28 DIAGNOSIS — Z1329 Encounter for screening for other suspected endocrine disorder: Secondary | ICD-10-CM

## 2022-08-28 DIAGNOSIS — G4700 Insomnia, unspecified: Secondary | ICD-10-CM

## 2022-08-28 DIAGNOSIS — Z13228 Encounter for screening for other metabolic disorders: Secondary | ICD-10-CM

## 2022-08-28 DIAGNOSIS — E7800 Pure hypercholesterolemia, unspecified: Secondary | ICD-10-CM

## 2022-08-28 DIAGNOSIS — Z130 Encounter for screening for diseases of the blood and blood-forming organs and certain disorders involving the immune mechanism: Secondary | ICD-10-CM

## 2022-08-28 LAB — INSULIN, FREE AND TOTAL

## 2022-08-28 LAB — TSH+T4F+T3FREE+THYABS+TPO+VD25

## 2022-08-28 MED ORDER — SERTRALINE HCL 100 MG PO TABS
150.0000 mg | ORAL_TABLET | Freq: Every day | ORAL | 3 refills | Status: DC
Start: 2022-08-28 — End: 2023-07-11
  Filled 2022-08-28: qty 145, 96d supply, fill #0
  Filled 2022-08-29 – 2022-09-14 (×4): qty 45, 30d supply, fill #0
  Filled 2022-10-12: qty 45, 30d supply, fill #1
  Filled 2022-11-12: qty 45, 30d supply, fill #2
  Filled 2022-12-11: qty 45, 30d supply, fill #3
  Filled 2023-01-09: qty 45, 30d supply, fill #4
  Filled 2023-02-08: qty 45, 30d supply, fill #5
  Filled 2023-03-11: qty 45, 30d supply, fill #6
  Filled 2023-04-05: qty 45, 30d supply, fill #7
  Filled 2023-05-06: qty 45, 30d supply, fill #8
  Filled 2023-06-03 – 2023-07-10 (×5): qty 45, 30d supply, fill #9

## 2022-08-28 MED ORDER — SERTRALINE HCL 100 MG PO TABS
150.0000 mg | ORAL_TABLET | Freq: Every day | ORAL | 3 refills | Status: DC
Start: 2022-08-28 — End: 2022-08-28

## 2022-08-28 MED ORDER — ROSUVASTATIN CALCIUM 20 MG PO TABS
20.0000 mg | ORAL_TABLET | Freq: Every day | ORAL | 3 refills | Status: DC
Start: 2022-08-28 — End: 2023-08-02
  Filled 2022-08-28 – 2022-09-14 (×3): qty 30, 30d supply, fill #0
  Filled 2022-10-12: qty 30, 30d supply, fill #1
  Filled 2022-11-12: qty 30, 30d supply, fill #2
  Filled 2022-12-11: qty 30, 30d supply, fill #3
  Filled 2023-01-09: qty 30, 30d supply, fill #4
  Filled 2023-02-08: qty 30, 30d supply, fill #5
  Filled 2023-03-11: qty 30, 30d supply, fill #6
  Filled 2023-04-05: qty 30, 30d supply, fill #7
  Filled 2023-05-01: qty 90, 90d supply, fill #8

## 2022-08-28 NOTE — Progress Notes (Signed)
Carrie Clamp, DNP, AGNP-c [facility]  [address] [place], [place] [address] [phone]  ESTABLISHED PATIENT- Chronic Health and/or Follow-Up Visit  Blood pressure 118/74, pulse 68, weight 207 lb (93.9 kg).    Carrie Garrison is a 62 y.o. year old female presenting today for evaluation and management of chronic conditions.   Carrie Garrison presents today with chief complaints of ongoing issues with sleep and weight management, which she attributes to a need to consume a specific number of calories to fall asleep, due to what she describes as a cortisol and melatonin imbalance. She notes that despite eating a diet free from processed foods and consisting mainly of vegetables and protein, she has not experienced weight loss. Carrie Garrison mentions experimenting with meal sizes and exercise but has not seen the desired results. She expresses concern about modifying her diet further, such as reducing sweet potato intake, and contemplates replacing it with alternatives like hard-boiled eggs or guacamole.   Carrie Garrison also discusses her dietary adjustments over the years, including eliminating processed foods and focusing on protein-rich foods like pork and chicken, alongside fruits and vegetables. She indicates that these changes are in response to fluctuating insulin levels, which she monitors using a blood sugar monitor. She has observed that her blood sugar spikes with carbohydrate intake and manages this by avoiding carbs. She mentions ongoing tests and consultations, including a stool sample and upcoming hair analysis, to further investigate her metabolic health, as previous metabolism tests appeared normal.  She recounts a past experience during a trip to [place] where she lost weight, slept well and felt well with no specific changes. She attributes this to the regimen prescribed by a functional medicine doctor at the time, which included supplements like magnesium and vitamin K. She reports she is  taking these supplements at this time.   Carrie Garrison mentions adjusting her Zoloft dosage from 200 mg to 150 mg, finding the lower dose more suitable.   Additionally, she describes symptoms related to barometric pressure changes affecting her head and joints and an upper respiratory issue causing prolonged symptoms of coughing and clogged ears, though she notes the fluid is clear, indicating no infection.  Lastly, Carrie Garrison recalls a recent episode of shivering followed by nausea, which resolved after vomiting, and she attributes this quick recovery to her balanced health regimen.   She has a list of specific labs she would like completed today based on recommendations from her functional medicine doctor.   All ROS negative with exception of what is listed above.   PHYSICAL EXAM Physical Exam Vitals and nursing note reviewed.  Constitutional:      Appearance: Normal appearance. She is obese.  HENT:     Head: Normocephalic.  Eyes:     Pupils: Pupils are equal, round, and reactive to light.  Neck:     Vascular: No carotid bruit.  Cardiovascular:     Rate and Rhythm: Normal rate and regular rhythm.     Pulses: Normal pulses.     Heart sounds: Normal heart sounds.  Pulmonary:     Effort: Pulmonary effort is normal.     Breath sounds: Normal breath sounds.  Abdominal:     General: Bowel sounds are normal. There is no distension.     Palpations: Abdomen is soft.     Tenderness: There is no abdominal tenderness. There is no guarding.  Musculoskeletal:     Right lower leg: No edema.     Left lower leg: No edema.  Skin:  General: Skin is warm and dry.     Capillary Refill: Capillary refill takes less than 2 seconds.  Neurological:     General: No focal deficit present.     Mental Status: She is alert and oriented to person, place, and time.  Psychiatric:        Mood and Affect: Mood normal.     PLAN Problem List Items Addressed This Visit     Insomnia - Primary    Difficulty falling  asleep when specific number of calories is not met during the day. She feels that this is a correlation with her cortisol and melatonin levels. Followed by functional medicine for evaluation and monitoring, as well as treatment.  Plan: - continue to work with functional medicine to develop a balance.  - Keep a consistent sleep schedule and wake schedule       Relevant Orders   Comprehensive metabolic panel (Completed)   Gamma GT (Completed)   Uric Acid (Completed)   Phosphorus (Completed)   Lactate Dehydrogenase (Completed)   Lipid Cascade (Completed)   CBC with Differential/Platelet (Completed)   Iron, TIBC and Ferritin Panel (Completed)   T3 (Completed)   T3 Uptake (Completed)   T3, reverse (Completed)   TSH+T4F+T3Free+ThyAbs+TPO+VD25 (Completed)   T4 (Completed)   Insulin, Free and Total (Completed)   Hemoglobin A1c (Completed)   Homocysteine (Completed)   Unable to lose weight    Despite dietary modifications and exercise, patient is experiencing difficulty in weight loss. Plan: - Metabolism assessment to be conducted by functional medicine specialist. - Continue monitoring caloric intake and exercise regimen. - Consider referral to a nutritionist for further dietary guidance if needed.       Relevant Orders   Comprehensive metabolic panel (Completed)   Gamma GT (Completed)   Uric Acid (Completed)   Phosphorus (Completed)   Lactate Dehydrogenase (Completed)   Lipid Cascade (Completed)   CBC with Differential/Platelet (Completed)   Iron, TIBC and Ferritin Panel (Completed)   T3 (Completed)   T3 Uptake (Completed)   T3, reverse (Completed)   TSH+T4F+T3Free+ThyAbs+TPO+VD25 (Completed)   T4 (Completed)   Insulin, Free and Total (Completed)   Hemoglobin A1c (Completed)   Homocysteine (Completed)   Anhedonia    Reported symptom associated with higher dose of sertraline. Currently doing well on 150mg . Plan: - Continue sertraline 150mg  - Report and new or worsening  mood symptoms      Relevant Medications   sertraline (ZOLOFT) 100 MG tablet   Other Visit Diagnoses     Screening for endocrine, nutritional, metabolic and immunity disorder       Relevant Orders   Comprehensive metabolic panel (Completed)   Gamma GT (Completed)   Uric Acid (Completed)   Phosphorus (Completed)   Lactate Dehydrogenase (Completed)   Lipid Cascade (Completed)   CBC with Differential/Platelet (Completed)   Iron, TIBC and Ferritin Panel (Completed)   T3 (Completed)   T3 Uptake (Completed)   T3, reverse (Completed)   TSH+T4F+T3Free+ThyAbs+TPO+VD25 (Completed)   T4 (Completed)   Insulin, Free and Total (Completed)   Hemoglobin A1c (Completed)   Homocysteine (Completed)   Pure hypercholesterolemia       Relevant Medications   rosuvastatin (CRESTOR) 20 MG tablet       No follow-ups on file.   Carrie Clamp, DNP, AGNP-c 08/28/2022  3:48 PM

## 2022-08-29 ENCOUNTER — Other Ambulatory Visit (HOSPITAL_BASED_OUTPATIENT_CLINIC_OR_DEPARTMENT_OTHER): Payer: Self-pay

## 2022-08-29 LAB — TSH+T4F+T3FREE+THYABS+TPO+VD25
T3, Free: 2.6 pg/mL (ref 2.0–4.4)
Vit D, 25-Hydroxy: 59.8 ng/mL (ref 30.0–100.0)

## 2022-08-29 LAB — HOMOCYSTEINE: Homocysteine: 10.5 umol/L (ref 0.0–17.2)

## 2022-08-29 LAB — HEMOGLOBIN A1C: Hgb A1c MFr Bld: 5.9 % — ABNORMAL HIGH (ref 4.8–5.6)

## 2022-08-29 LAB — T3: T3, Total: 100 ng/dL (ref 71–180)

## 2022-08-30 LAB — TSH+T4F+T3FREE+THYABS+TPO+VD25: Free T4: 1.2 ng/dL (ref 0.82–1.77)

## 2022-08-30 LAB — INSULIN, FREE AND TOTAL

## 2022-08-31 ENCOUNTER — Other Ambulatory Visit (HOSPITAL_BASED_OUTPATIENT_CLINIC_OR_DEPARTMENT_OTHER): Payer: Self-pay

## 2022-09-04 ENCOUNTER — Other Ambulatory Visit (HOSPITAL_BASED_OUTPATIENT_CLINIC_OR_DEPARTMENT_OTHER): Payer: Self-pay

## 2022-09-04 LAB — CBC WITH DIFFERENTIAL/PLATELET
Basophils Absolute: 0 10*3/uL (ref 0.0–0.2)
Basos: 1 %
EOS (ABSOLUTE): 0.2 10*3/uL (ref 0.0–0.4)
Eos: 2 %
Hematocrit: 43.1 % (ref 34.0–46.6)
Hemoglobin: 13.7 g/dL (ref 11.1–15.9)
Immature Grans (Abs): 0 10*3/uL (ref 0.0–0.1)
Immature Granulocytes: 1 %
Lymphocytes Absolute: 1.5 10*3/uL (ref 0.7–3.1)
Lymphs: 24 %
MCH: 27.5 pg (ref 26.6–33.0)
MCHC: 31.8 g/dL (ref 31.5–35.7)
MCV: 87 fL (ref 79–97)
Monocytes Absolute: 0.4 10*3/uL (ref 0.1–0.9)
Monocytes: 6 %
Neutrophils Absolute: 4.4 10*3/uL (ref 1.4–7.0)
Neutrophils: 66 %
Platelets: 247 10*3/uL (ref 150–450)
RBC: 4.98 x10E6/uL (ref 3.77–5.28)
RDW: 13.1 % (ref 11.7–15.4)
WBC: 6.5 10*3/uL (ref 3.4–10.8)

## 2022-09-04 LAB — COMPREHENSIVE METABOLIC PANEL
ALT: 41 IU/L — ABNORMAL HIGH (ref 0–32)
AST: 36 IU/L (ref 0–40)
Albumin/Globulin Ratio: 1.8 (ref 1.2–2.2)
Albumin: 4.4 g/dL (ref 3.9–4.9)
Alkaline Phosphatase: 101 IU/L (ref 44–121)
BUN/Creatinine Ratio: 25 (ref 12–28)
BUN: 25 mg/dL (ref 8–27)
Bilirubin Total: 0.2 mg/dL (ref 0.0–1.2)
CO2: 23 mmol/L (ref 20–29)
Calcium: 9.5 mg/dL (ref 8.7–10.3)
Chloride: 103 mmol/L (ref 96–106)
Creatinine, Ser: 1.02 mg/dL — ABNORMAL HIGH (ref 0.57–1.00)
Globulin, Total: 2.5 g/dL (ref 1.5–4.5)
Glucose: 91 mg/dL (ref 70–99)
Potassium: 4.4 mmol/L (ref 3.5–5.2)
Sodium: 141 mmol/L (ref 134–144)
Total Protein: 6.9 g/dL (ref 6.0–8.5)
eGFR: 63 mL/min/{1.73_m2} (ref >59)

## 2022-09-04 LAB — T3 UPTAKE
Free Thyroxine Index: 1.9 (ref 1.2–4.9)
T3 Uptake Ratio: 25 % (ref 24–39)

## 2022-09-04 LAB — LIPID CASCADE
Cholesterol, Total: 219 mg/dL — ABNORMAL HIGH (ref 100–199)
HDL: 49 mg/dL (ref >39)
LDL Chol Calc (NIH): 146 mg/dL — ABNORMAL HIGH (ref 0–99)
LDL/HDL Ratio: 3 ratio (ref 0.0–3.2)
Total Non-HDL-Chol (LDL+VLDL): 170 mg/dL — ABNORMAL HIGH (ref 0–129)
Triglycerides: 131 mg/dL (ref 0–149)

## 2022-09-04 LAB — IRON,TIBC AND FERRITIN PANEL
Ferritin: 409 ng/mL — ABNORMAL HIGH (ref 15–150)
Iron Saturation: 18 % (ref 15–55)
Iron: 57 ug/dL (ref 27–139)
Total Iron Binding Capacity: 310 ug/dL (ref 250–450)
UIBC: 253 ug/dL (ref 118–369)

## 2022-09-04 LAB — T4: T4, Total: 7.7 ug/dL (ref 4.5–12.0)

## 2022-09-04 LAB — PHOSPHORUS: Phosphorus: 3.3 mg/dL (ref 3.0–4.3)

## 2022-09-04 LAB — HEMOGLOBIN A1C: Est. average glucose Bld gHb Est-mCnc: 123 mg/dL

## 2022-09-04 LAB — LACTATE DEHYDROGENASE: LDH: 216 IU/L (ref 119–226)

## 2022-09-04 LAB — T3, REVERSE: Reverse T3, Serum: 20.6 ng/dL (ref 9.2–24.1)

## 2022-09-04 LAB — GAMMA GT: GGT: 21 IU/L (ref 0–60)

## 2022-09-04 LAB — URIC ACID: Uric Acid: 4.8 mg/dL (ref 3.0–7.2)

## 2022-09-04 LAB — TSH+T4F+T3FREE+THYABS+TPO+VD25: TSH: 1.41 u[IU]/mL (ref 0.450–4.500)

## 2022-09-14 ENCOUNTER — Other Ambulatory Visit: Payer: Self-pay

## 2022-09-14 ENCOUNTER — Other Ambulatory Visit (HOSPITAL_BASED_OUTPATIENT_CLINIC_OR_DEPARTMENT_OTHER): Payer: Self-pay

## 2022-09-14 DIAGNOSIS — R638 Other symptoms and signs concerning food and fluid intake: Secondary | ICD-10-CM | POA: Insufficient documentation

## 2022-09-14 DIAGNOSIS — G4700 Insomnia, unspecified: Secondary | ICD-10-CM | POA: Insufficient documentation

## 2022-09-14 DIAGNOSIS — R4584 Anhedonia: Secondary | ICD-10-CM | POA: Insufficient documentation

## 2022-09-14 NOTE — Assessment & Plan Note (Signed)
Difficulty falling asleep when specific number of calories is not met during the day. She feels that this is a correlation with her cortisol and melatonin levels. Followed by functional medicine for evaluation and monitoring, as well as treatment.  Plan: - continue to work with functional medicine to develop a balance.  - Keep a consistent sleep schedule and wake schedule

## 2022-09-14 NOTE — Assessment & Plan Note (Signed)
>>  ASSESSMENT AND PLAN FOR HYPERLIPIDEMIA WRITTEN ON 09/14/2022  7:39 PM BY Camilia Caywood E, NP  Currently managing with diet and rosuvastatin 20mg . No alarm symptoms present at this time.  Plan: - Continue current diet and exercise regimen - Continue rosuvastatin - Labs pending

## 2022-09-14 NOTE — Assessment & Plan Note (Signed)
Currently managing with diet and rosuvastatin 20mg . No alarm symptoms present at this time.  Plan: - Continue current diet and exercise regimen - Continue rosuvastatin - Labs pending

## 2022-09-14 NOTE — Assessment & Plan Note (Signed)
Despite dietary modifications and exercise, patient is experiencing difficulty in weight loss. Plan: - Metabolism assessment to be conducted by functional medicine specialist. - Continue monitoring caloric intake and exercise regimen. - Consider referral to a nutritionist for further dietary guidance if needed.

## 2022-09-14 NOTE — Assessment & Plan Note (Signed)
Reported symptom associated with higher dose of sertraline. Currently doing well on 150mg . Plan: - Continue sertraline 150mg  - Report and new or worsening mood symptoms

## 2022-09-16 ENCOUNTER — Encounter: Payer: Self-pay | Admitting: Nurse Practitioner

## 2022-09-21 ENCOUNTER — Encounter: Payer: Self-pay | Admitting: Nurse Practitioner

## 2022-09-24 ENCOUNTER — Encounter: Payer: Self-pay | Admitting: Nurse Practitioner

## 2022-09-26 ENCOUNTER — Other Ambulatory Visit (HOSPITAL_BASED_OUTPATIENT_CLINIC_OR_DEPARTMENT_OTHER): Payer: Self-pay

## 2022-09-26 ENCOUNTER — Encounter: Payer: Self-pay | Admitting: Nurse Practitioner

## 2022-09-26 ENCOUNTER — Telehealth: Payer: No Typology Code available for payment source | Admitting: Nurse Practitioner

## 2022-09-26 VITALS — Wt 207.0 lb

## 2022-09-26 DIAGNOSIS — E038 Other specified hypothyroidism: Secondary | ICD-10-CM

## 2022-09-26 MED ORDER — THYROID 15 MG PO TABS
15.0000 mg | ORAL_TABLET | Freq: Every day | ORAL | 3 refills | Status: DC
Start: 2022-09-26 — End: 2023-04-02
  Filled 2022-09-26: qty 30, 30d supply, fill #0
  Filled 2022-09-29: qty 30, 30d supply, fill #1
  Filled 2022-11-19: qty 30, 30d supply, fill #2

## 2022-09-26 NOTE — Progress Notes (Signed)
Virtual Visit Encounter mychart visit.   I connected with  Carrie Garrison on 09/26/22 at 11:15 AM EDT by secure video and audio telemedicine application. I verified that I am speaking with the correct person using two identifiers.   I introduced myself as a Publishing rights manager with the practice. The limitations of evaluation and management by telemedicine discussed with the patient and the availability of in person appointments. The patient expressed verbal understanding and consent to proceed.  Participating parties in this visit include: Myself and patient  The patient is: Patient Location: Home I am: Provider Location: Office/Clinic Subjective:    CC and HPI: Carrie Garrison is a 62 y.o. year old female presenting for follow up of thryoid. Patient reports the following:  Larysa would like to discuss starting medication for subclinical hypothyroidism today. She inquires about the differences between NP thyroid, [place] thyroid, and levothyroxine, as well as their costs and insurance coverage.    Past medical history, Surgical history, Family history not pertinant except as noted below, Social history, Allergies, and medications have been entered into the medical record, reviewed, and corrections made.   Review of Systems:  All review of systems negative except what is listed in the HPI  Objective:    Alert and oriented x 4 Speaking in clear sentences with no shortness of breath. No distress.  Impression and Recommendations:    Problem List Items Addressed This Visit   None   orders and follow up as documented in EMR I discussed the assessment and treatment plan with the patient. The patient was provided an opportunity to ask questions and all were answered. The patient agreed with the plan and demonstrated an understanding of the instructions.   The patient was advised to call back or seek an in-person evaluation if the symptoms worsen or if the condition fails to improve as  anticipated.  Follow-Up: 6-8 weeks for thyroid recheck- lab only  I provided 18 minutes of non-face-to-face interaction with this non face-to-face encounter including intake, same-day documentation, and chart review.   Tollie Eth, NP , DNP, AGNP-c [facility]

## 2022-09-29 ENCOUNTER — Other Ambulatory Visit (HOSPITAL_BASED_OUTPATIENT_CLINIC_OR_DEPARTMENT_OTHER): Payer: Self-pay

## 2022-10-02 ENCOUNTER — Encounter: Payer: Self-pay | Admitting: Nurse Practitioner

## 2022-11-18 MED ORDER — LEVOTHYROXINE SODIUM 25 MCG PO TABS
25.0000 ug | ORAL_TABLET | Freq: Every day | ORAL | 3 refills | Status: DC
Start: 2022-11-18 — End: 2023-11-19
  Filled 2022-11-18: qty 30, 30d supply, fill #0
  Filled 2022-12-12: qty 30, 30d supply, fill #1
  Filled 2023-01-11: qty 30, 30d supply, fill #2
  Filled 2023-02-11: qty 30, 30d supply, fill #3
  Filled 2023-03-11: qty 30, 30d supply, fill #4
  Filled 2023-04-05: qty 30, 30d supply, fill #5
  Filled 2023-05-01: qty 90, 90d supply, fill #6
  Filled 2023-08-02 – 2023-08-13 (×2): qty 90, 90d supply, fill #7

## 2022-11-18 NOTE — Assessment & Plan Note (Signed)
Diagnosis of subclinical hypothyroidism with depressive symptoms and difficulty management weight as primary symptoms. We discussed medication options today.  Plan: - start NP thyroid 15mg  daily based on patient preference.  - monitor labs only in 6-8 weeks

## 2022-11-19 ENCOUNTER — Other Ambulatory Visit (HOSPITAL_BASED_OUTPATIENT_CLINIC_OR_DEPARTMENT_OTHER): Payer: Self-pay

## 2023-02-11 ENCOUNTER — Other Ambulatory Visit (HOSPITAL_BASED_OUTPATIENT_CLINIC_OR_DEPARTMENT_OTHER): Payer: Self-pay

## 2023-02-12 ENCOUNTER — Encounter: Payer: Self-pay | Admitting: Nurse Practitioner

## 2023-02-12 ENCOUNTER — Telehealth: Payer: No Typology Code available for payment source | Admitting: Nurse Practitioner

## 2023-02-12 ENCOUNTER — Other Ambulatory Visit (HOSPITAL_BASED_OUTPATIENT_CLINIC_OR_DEPARTMENT_OTHER): Payer: Self-pay

## 2023-02-12 VITALS — Wt 207.0 lb

## 2023-02-12 DIAGNOSIS — Z23 Encounter for immunization: Secondary | ICD-10-CM | POA: Diagnosis not present

## 2023-02-12 DIAGNOSIS — E2839 Other primary ovarian failure: Secondary | ICD-10-CM

## 2023-02-12 DIAGNOSIS — E038 Other specified hypothyroidism: Secondary | ICD-10-CM

## 2023-02-12 MED ORDER — PROGESTERONE MICRONIZED 100 MG PO CAPS
100.0000 mg | ORAL_CAPSULE | Freq: Every day | ORAL | 2 refills | Status: DC
Start: 2023-02-12 — End: 2023-04-05
  Filled 2023-02-12: qty 30, 30d supply, fill #0
  Filled 2023-03-08: qty 30, 30d supply, fill #1

## 2023-02-12 MED ORDER — ESTRADIOL 1 MG PO TABS
1.0000 mg | ORAL_TABLET | Freq: Every day | ORAL | 2 refills | Status: DC
Start: 2023-02-12 — End: 2023-04-05
  Filled 2023-02-12: qty 30, 30d supply, fill #0
  Filled 2023-03-08: qty 30, 30d supply, fill #1

## 2023-02-13 ENCOUNTER — Other Ambulatory Visit (HOSPITAL_BASED_OUTPATIENT_CLINIC_OR_DEPARTMENT_OTHER): Payer: Self-pay

## 2023-02-19 DIAGNOSIS — Z23 Encounter for immunization: Secondary | ICD-10-CM | POA: Insufficient documentation

## 2023-02-19 DIAGNOSIS — E2839 Other primary ovarian failure: Secondary | ICD-10-CM | POA: Insufficient documentation

## 2023-02-19 MED ORDER — COVID-19 MRNA VACCINE (PFIZER) 30 MCG/0.3ML IM SUSP
0.3000 mL | Freq: Once | INTRAMUSCULAR | 0 refills | Status: AC
Start: 2023-02-19 — End: 2023-02-21
  Filled 2023-02-19 – 2023-02-21 (×2): qty 0.3, 1d supply, fill #0

## 2023-02-19 NOTE — Assessment & Plan Note (Signed)
Persistent despite functional medicine approach for three years. Noted improvement with thyroid medication. Discussed the potential role of hormones in sleep and eating patterns. -Start low-dose estrogen and progesterone therapy. -Check hormone levels in three months to ensure therapeutic range.

## 2023-02-19 NOTE — Assessment & Plan Note (Signed)
Insurance coverage issue for COVID-19 vaccine. Discussed the possibility of obtaining coverage through a prescription. -Send prescription for COVID-19 vaccine to [facility] pharmacy.

## 2023-02-19 NOTE — Progress Notes (Signed)
Virtual Visit Encounter mychart visit.   I connected with  Carrie Garrison on 02/19/23 at  3:30 PM EDT by secure video and audio telemedicine application. I verified that I am speaking with the correct person using two identifiers.   I introduced myself as a Publishing rights manager with the practice. The limitations of evaluation and management by telemedicine discussed with the patient and the availability of in person appointments. The patient expressed verbal understanding and consent to proceed.  Participating parties in this visit include: Myself and patient  The patient is: Patient Location: Home I am: Provider Location: Office/Clinic Subjective:    CC and HPI: Carrie Garrison is a 62 y.o. year old female presenting for new evaluation and treatment of hormones.  History of Present Illness Carrie Garrison presents with ongoing sleep and eating issues despite three years of functional medicine treatment. She reports no improvement in her sleep-eating problem, which has been consistent since her late 40s. The patient notes a significant positive change in her overall health since starting thyroid medication, despite initial tests not indicating a need for it.  The patient also reports a history of birth control use from age 5, which when discontinued in her Carrie Garrison 89s, led to significant depression. A psychiatrist at the time attributed this to the body's lack of progesterone production and provided temporary progesterone.  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 She expresses readiness to experiment with different hormone treatments and monitor her sleep and eating patterns closely for any changes. She also mentions a correlation between her diet, specifically animal protein intake, and her sleep quality.  In addition, the patient is seeking assistance with obtaining the COVID-19 vaccine, as her insurance  company currently does not cover it. She requests a prescription for the vaccine to see if it can be covered in this manner.  Past medical history, Surgical history, Family history not pertinant except as noted below, Social history, Allergies, and medications have been entered into the medical record, reviewed, and corrections made.   Review of Systems:  All review of systems negative except what is listed in the HPI  Objective:    Alert and oriented x 4 Speaking in clear sentences with no shortness of breath. No distress.  Impression and Recommendations:    Problem List Items Addressed This Visit     Subclinical hypothyroidism    Persistent despite functional medicine approach for three years. Noted improvement with thyroid medication. Discussed the potential role of hormones in sleep and eating patterns. -Start low-dose estrogen and progesterone therapy. -Check hormone levels in three months to ensure therapeutic range.      Need for COVID-19 vaccine    Insurance coverage issue for COVID-19 vaccine. Discussed the possibility of obtaining coverage through a prescription. -Send prescription for COVID-19 vaccine to [facility] pharmacy.      Relevant Medications   COVID-19 mRNA vaccine, Pfizer, 30 MCG/0.3ML injection   Primary ovarian failure - Primary    Discussed history of birth control use and potential impact on hormone levels. Noted possible low testosterone level from previous testing. -Review previous testosterone levels. -Consider compounded testosterone cream if levels are low.      Relevant Medications   progesterone (PROMETRIUM) 100 MG capsule   estradiol (ESTRACE) 1 MG tablet    orders and follow up as documented in EMR I discussed the assessment and treatment plan with the patient. The patient was provided an opportunity to ask  questions and all were answered. The patient agreed with the plan and demonstrated an understanding of the instructions.   The patient  was advised to call back or seek an in-person evaluation if the symptoms worsen or if the condition fails to improve as anticipated.  Follow-Up: in a few months  I provided 19 minutes of non-face-to-face interaction with this non face-to-face encounter including intake, same-day documentation, and chart review.   Tollie Eth, NP , DNP, AGNP-c [facility]

## 2023-02-19 NOTE — Assessment & Plan Note (Signed)
Discussed history of birth control use and potential impact on hormone levels. Noted possible low testosterone level from previous testing. -Review previous testosterone levels. -Consider compounded testosterone cream if levels are low.

## 2023-02-20 ENCOUNTER — Other Ambulatory Visit (HOSPITAL_BASED_OUTPATIENT_CLINIC_OR_DEPARTMENT_OTHER): Payer: Self-pay

## 2023-02-20 IMAGING — CT CT CARDIAC CORONARY ARTERY CALCIUM SCORE
3 series · 14 of 20 positions shown, 16 images · non-contrast
Comparison: None.

Addendum:
CLINICAL DATA: Cardiovascular disease risk stratification

CAD screening, high CAD risk, not treadmill candidate
EXAM:
CT Coronary Calcium Score
TECHNIQUE: A gated, non-contrast computed tomography scan of the heart was
performed using 3mm slice thickness. Axial images were analyzed on a
dedicated workstation. Calcium scoring of the coronary arteries was
performed using the Agatston method.

[Series 2: ax lung · axial · 0.93mm/px · z∈[-72,+18]mm · 5 of 69 slices shown]
[im 12/69  lung]
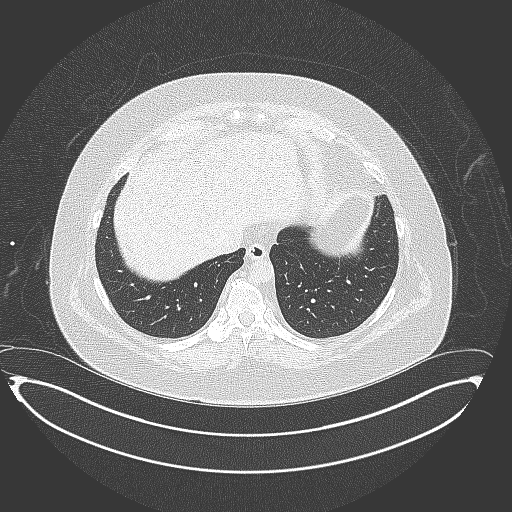
[im 23/69  lung]
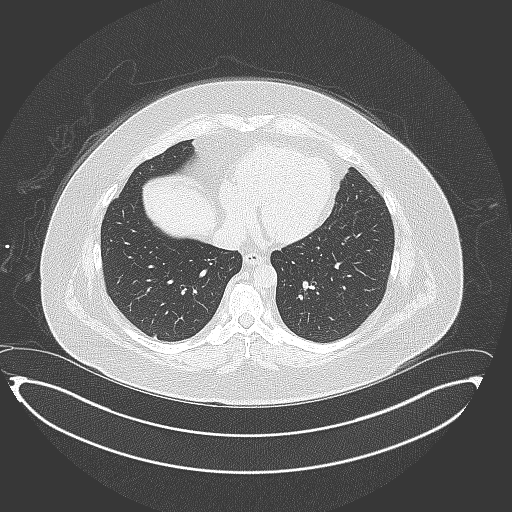
[im 35/69  lung]
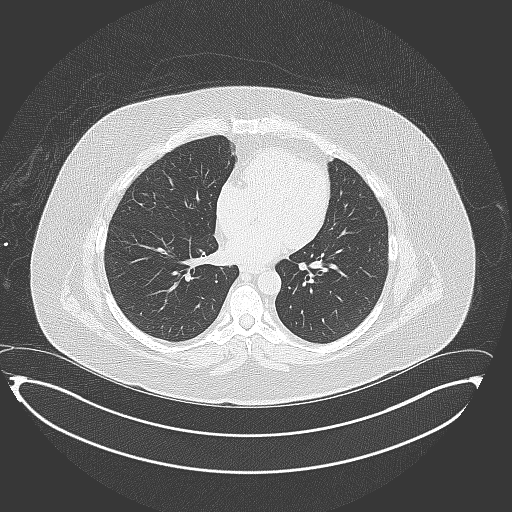
[im 46/69  lung]
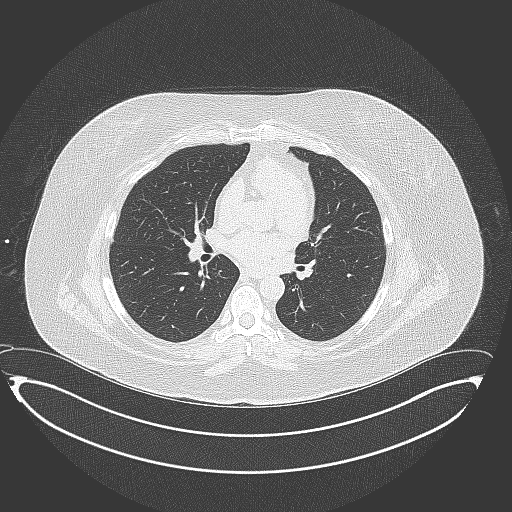
[im 57/69  lung]
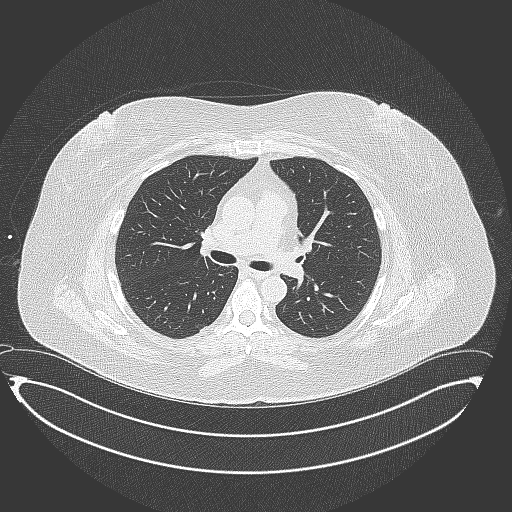

[Series 3: cascseq 3.0 sa36 70% (id) · axial · 0.39mm/px · z∈[-60,+6]mm · 3 of 46 slices shown]
[im 12/46  vessel]
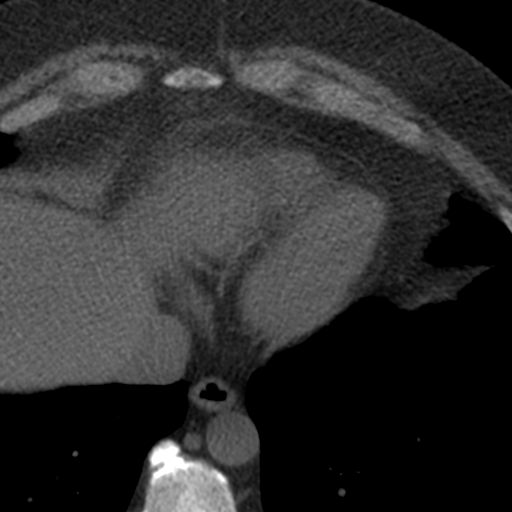
[im 23/46  vessel]
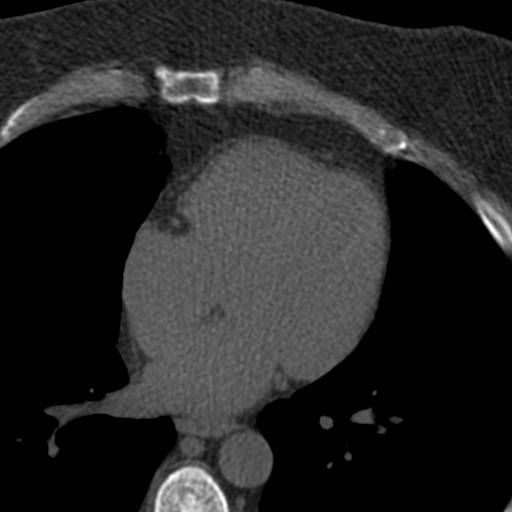
[im 34/46  vessel]
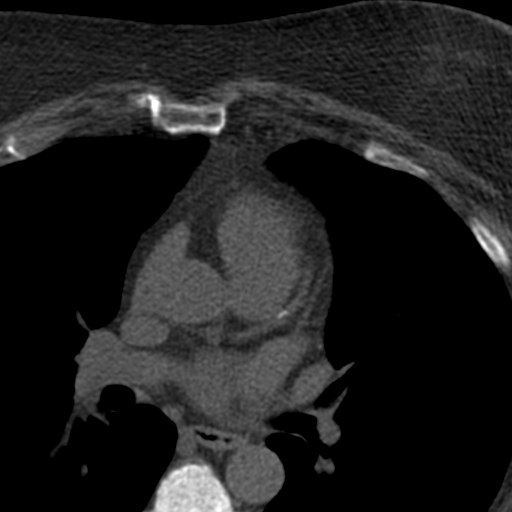

[Series 4: ax st · axial · 0.93mm/px · z∈[-76,+22]mm · 6 of 69 slices shown, 8 images]
[im 10/69  vessel]
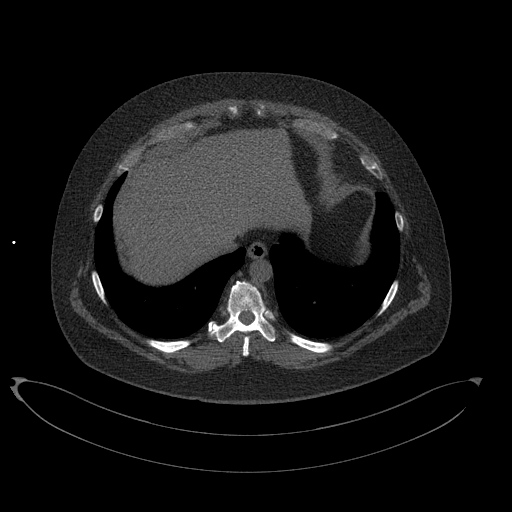
[im 10/69  lung]
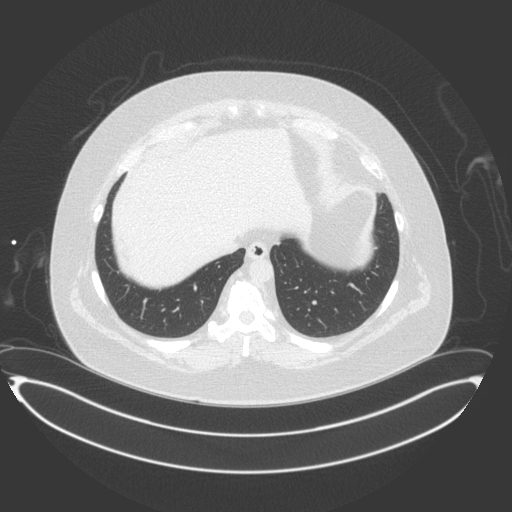
[im 20/69  vessel]
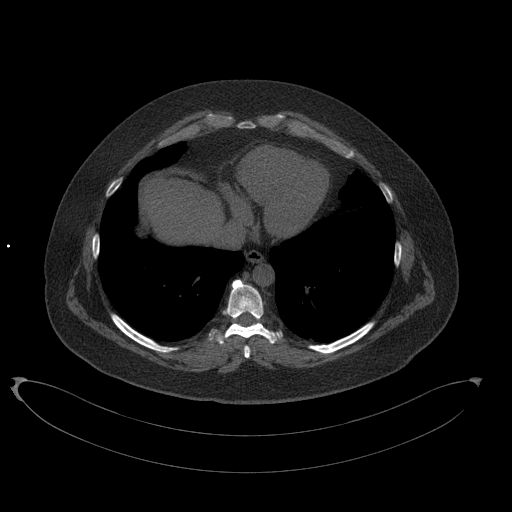
[im 30/69  vessel]
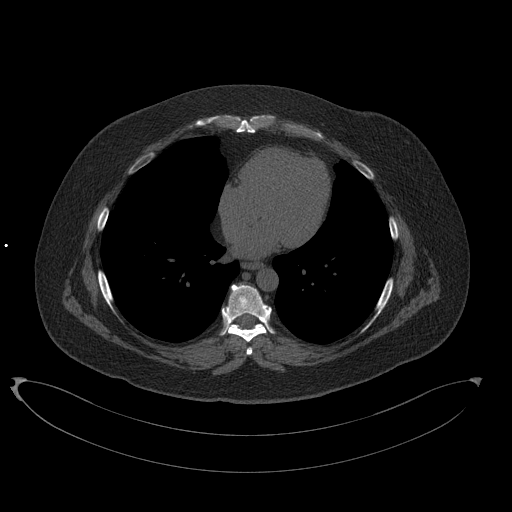
[im 39/69  vessel]
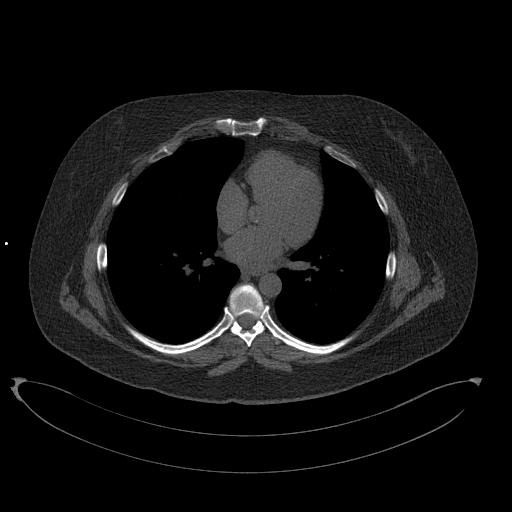
[im 49/69  vessel]
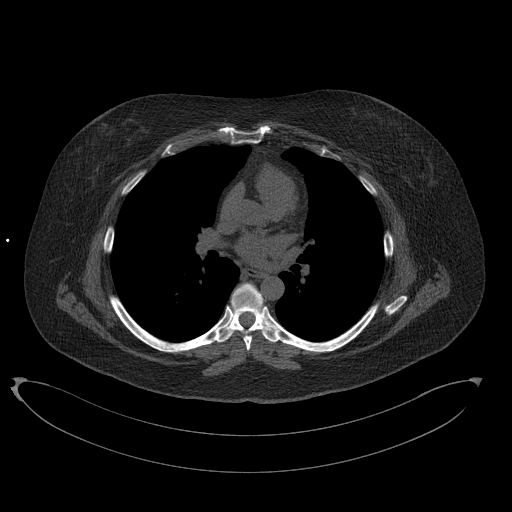
[im 49/69  lung]
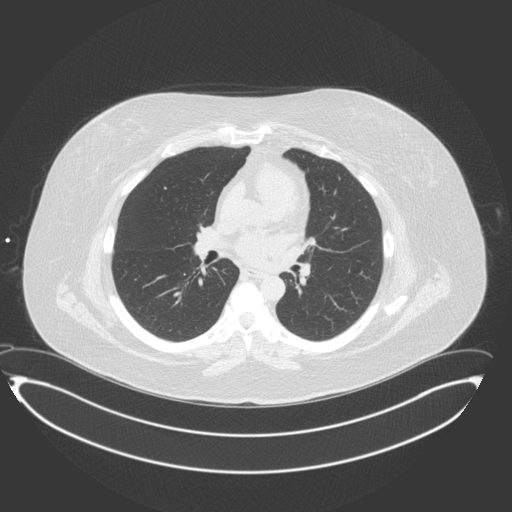
[im 59/69  vessel]
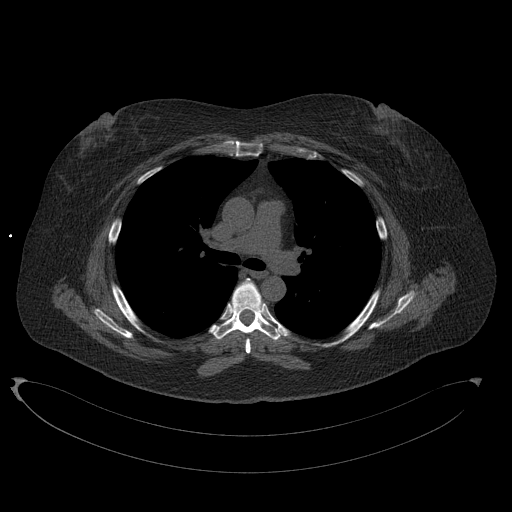

[14 of 20 positions shown; findings below may reference images not displayed]

FINDINGS: Coronary Calcium Score:

Left main: 0

Left anterior descending artery: 10

Left circumflex artery: 0

Right coronary artery: 0

Total: 10

Percentile: 71st

Pericardium: Normal.

Ascending Aorta: Normal caliber. Ascending aorta measures
approximately 31mm at the mid ascending aorta measured in an axial
plane.

Non-cardiac: See separate report from [REDACTED].
IMPRESSION: Coronary calcium score of 10. This was 71st percentile for age-,
race-, and sex-matched controls.



If CAC=0, it is reasonable to withhold statin therapy and reassess
in 5 to 10 years, as long as higher risk conditions are absent
(diabetes mellitus, family history of premature CHD in first degree
relatives (males <55 years; females <65 years), cigarette smoking,
or LDL >=190 mg/dL).

If CAC is 1 to 99, it is reasonable to initiate statin therapy for
patients >=55 years of age.

If CAC is >=100 or >=75th percentile, it is reasonable to initiate
statin therapy at any age.

Cardiology referral should be considered for patients with CAC
scores >=400 or >=75th percentile.

*1376 AHA/ACC/AACVPR/AAPA/ABC/DERETIC/MARY/CIRINO/Gassar/WRAIGHT/RUDI/TULLIER
Guideline on the Management of Blood Cholesterol: A Report of the
American College of Cardiology/American Heart Association Task Force
on Clinical Practice Guidelines. J Am Coll Cardiol.
5059;73(24):4865-4379.

EXAM:
OVER-READ INTERPRETATION  CT CHEST

The following report is an over-read performed by radiologist Dr.
over-read does not include interpretation of cardiac or coronary
anatomy or pathology. The coronary calcium score interpretation by
the cardiologist is attached.
FINDINGS: Within the visualized portions of the thorax there are no suspicious
appearing pulmonary nodules or masses, there is no acute
consolidative airspace disease, no pleural effusions, no
pneumothorax and no lymphadenopathy. Visualized portions of the
upper abdomen are unremarkable. There are no aggressive appearing
lytic or blastic lesions noted in the visualized portions of the
skeleton.
IMPRESSION: 1. No significant incidental noncardiac findings are noted.

*** End of Addendum ***
FINDINGS: Coronary Calcium Score:

Left main: 0

Left anterior descending artery: 10

Left circumflex artery: 0

Right coronary artery: 0

Total: 10

Percentile: 71st

Pericardium: Normal.

Ascending Aorta: Normal caliber. Ascending aorta measures
approximately 31mm at the mid ascending aorta measured in an axial
plane.

Non-cardiac: See separate report from [REDACTED].
IMPRESSION: Coronary calcium score of 10. This was 71st percentile for age-,
race-, and sex-matched controls.



If CAC=0, it is reasonable to withhold statin therapy and reassess
in 5 to 10 years, as long as higher risk conditions are absent
(diabetes mellitus, family history of premature CHD in first degree
relatives (males <55 years; females <65 years), cigarette smoking,
or LDL >=190 mg/dL).

If CAC is 1 to 99, it is reasonable to initiate statin therapy for
patients >=55 years of age.

If CAC is >=100 or >=75th percentile, it is reasonable to initiate
statin therapy at any age.

Cardiology referral should be considered for patients with CAC
scores >=400 or >=75th percentile.

*1376 AHA/ACC/AACVPR/AAPA/ABC/DERETIC/MARY/CIRINO/Gassar/WRAIGHT/RUDI/TULLIER
Guideline on the Management of Blood Cholesterol: A Report of the
American College of Cardiology/American Heart Association Task Force
on Clinical Practice Guidelines. J Am Coll Cardiol.
5059;73(24):4865-4379.

## 2023-02-21 ENCOUNTER — Other Ambulatory Visit (HOSPITAL_BASED_OUTPATIENT_CLINIC_OR_DEPARTMENT_OTHER): Payer: Self-pay

## 2023-03-05 ENCOUNTER — Encounter: Payer: Self-pay | Admitting: Nurse Practitioner

## 2023-03-21 ENCOUNTER — Encounter: Payer: Self-pay | Admitting: Nurse Practitioner

## 2023-03-21 DIAGNOSIS — E2839 Other primary ovarian failure: Secondary | ICD-10-CM

## 2023-04-05 ENCOUNTER — Other Ambulatory Visit (HOSPITAL_BASED_OUTPATIENT_CLINIC_OR_DEPARTMENT_OTHER): Payer: Self-pay

## 2023-04-05 MED ORDER — ESTRADIOL 1 MG PO TABS
1.5000 mg | ORAL_TABLET | Freq: Every day | ORAL | 2 refills | Status: DC
Start: 2023-04-05 — End: 2023-04-23
  Filled 2023-04-05: qty 45, 30d supply, fill #0

## 2023-04-05 MED ORDER — PROGESTERONE 200 MG PO CAPS
200.0000 mg | ORAL_CAPSULE | Freq: Every day | ORAL | 2 refills | Status: DC
Start: 2023-04-05 — End: 2023-04-23
  Filled 2023-04-05: qty 30, 30d supply, fill #0

## 2023-04-18 ENCOUNTER — Telehealth: Payer: No Typology Code available for payment source | Admitting: Nurse Practitioner

## 2023-04-23 ENCOUNTER — Other Ambulatory Visit (HOSPITAL_BASED_OUTPATIENT_CLINIC_OR_DEPARTMENT_OTHER): Payer: Self-pay

## 2023-04-23 ENCOUNTER — Other Ambulatory Visit: Payer: Self-pay

## 2023-04-23 DIAGNOSIS — E2839 Other primary ovarian failure: Secondary | ICD-10-CM

## 2023-04-23 MED ORDER — PROGESTERONE MICRONIZED 100 MG PO CAPS
100.0000 mg | ORAL_CAPSULE | Freq: Every day | ORAL | 2 refills | Status: DC
  Filled 2023-04-23: qty 30, 30d supply, fill #0
  Filled 2023-05-01: qty 60, 60d supply, fill #1

## 2023-04-23 MED ORDER — ESTRADIOL 1 MG PO TABS
1.0000 mg | ORAL_TABLET | Freq: Every day | ORAL | 2 refills | Status: DC
Start: 2023-04-23 — End: 2023-07-23
  Filled 2023-04-23 – 2023-04-29 (×5): qty 30, 30d supply, fill #0
  Filled 2023-05-01: qty 90, 90d supply, fill #0

## 2023-04-25 ENCOUNTER — Other Ambulatory Visit (HOSPITAL_BASED_OUTPATIENT_CLINIC_OR_DEPARTMENT_OTHER): Payer: Self-pay

## 2023-04-26 ENCOUNTER — Other Ambulatory Visit (HOSPITAL_BASED_OUTPATIENT_CLINIC_OR_DEPARTMENT_OTHER): Payer: Self-pay

## 2023-04-27 ENCOUNTER — Other Ambulatory Visit (HOSPITAL_BASED_OUTPATIENT_CLINIC_OR_DEPARTMENT_OTHER): Payer: Self-pay

## 2023-04-29 ENCOUNTER — Other Ambulatory Visit: Payer: Self-pay

## 2023-04-29 ENCOUNTER — Other Ambulatory Visit (HOSPITAL_BASED_OUTPATIENT_CLINIC_OR_DEPARTMENT_OTHER): Payer: Self-pay

## 2023-05-01 ENCOUNTER — Other Ambulatory Visit (HOSPITAL_BASED_OUTPATIENT_CLINIC_OR_DEPARTMENT_OTHER): Payer: Self-pay

## 2023-05-06 ENCOUNTER — Other Ambulatory Visit (HOSPITAL_BASED_OUTPATIENT_CLINIC_OR_DEPARTMENT_OTHER): Payer: Self-pay

## 2023-05-07 ENCOUNTER — Other Ambulatory Visit (HOSPITAL_BASED_OUTPATIENT_CLINIC_OR_DEPARTMENT_OTHER): Payer: Self-pay

## 2023-06-03 ENCOUNTER — Other Ambulatory Visit (HOSPITAL_BASED_OUTPATIENT_CLINIC_OR_DEPARTMENT_OTHER): Payer: Self-pay

## 2023-06-14 ENCOUNTER — Other Ambulatory Visit (HOSPITAL_BASED_OUTPATIENT_CLINIC_OR_DEPARTMENT_OTHER): Payer: Self-pay

## 2023-06-15 ENCOUNTER — Other Ambulatory Visit (HOSPITAL_BASED_OUTPATIENT_CLINIC_OR_DEPARTMENT_OTHER): Payer: Self-pay

## 2023-06-28 ENCOUNTER — Other Ambulatory Visit (HOSPITAL_BASED_OUTPATIENT_CLINIC_OR_DEPARTMENT_OTHER): Payer: Self-pay

## 2023-06-29 ENCOUNTER — Other Ambulatory Visit (HOSPITAL_BASED_OUTPATIENT_CLINIC_OR_DEPARTMENT_OTHER): Payer: Self-pay

## 2023-07-09 ENCOUNTER — Other Ambulatory Visit (HOSPITAL_BASED_OUTPATIENT_CLINIC_OR_DEPARTMENT_OTHER): Payer: Self-pay

## 2023-07-10 ENCOUNTER — Other Ambulatory Visit (HOSPITAL_BASED_OUTPATIENT_CLINIC_OR_DEPARTMENT_OTHER): Payer: Self-pay

## 2023-07-10 ENCOUNTER — Encounter: Payer: Self-pay | Admitting: Nurse Practitioner

## 2023-07-11 ENCOUNTER — Other Ambulatory Visit (HOSPITAL_BASED_OUTPATIENT_CLINIC_OR_DEPARTMENT_OTHER): Payer: Self-pay

## 2023-07-15 ENCOUNTER — Other Ambulatory Visit: Payer: Self-pay | Admitting: Nurse Practitioner

## 2023-07-15 ENCOUNTER — Other Ambulatory Visit (HOSPITAL_BASED_OUTPATIENT_CLINIC_OR_DEPARTMENT_OTHER): Payer: Self-pay

## 2023-07-15 MED ORDER — PROGESTERONE MICRONIZED 100 MG PO CAPS
100.0000 mg | ORAL_CAPSULE | Freq: Every day | ORAL | 2 refills | Status: AC
  Filled 2023-07-15: qty 30, 30d supply, fill #0

## 2023-07-23 ENCOUNTER — Other Ambulatory Visit: Payer: Self-pay | Admitting: Nurse Practitioner

## 2023-07-23 ENCOUNTER — Other Ambulatory Visit (HOSPITAL_BASED_OUTPATIENT_CLINIC_OR_DEPARTMENT_OTHER): Payer: Self-pay

## 2023-07-23 DIAGNOSIS — E2839 Other primary ovarian failure: Secondary | ICD-10-CM

## 2023-07-23 MED ORDER — ESTRADIOL 1 MG PO TABS
1.0000 mg | ORAL_TABLET | Freq: Every day | ORAL | 2 refills | Status: AC
  Filled 2023-07-23 – 2023-08-13 (×3): qty 30, 30d supply, fill #0

## 2023-07-25 ENCOUNTER — Other Ambulatory Visit (HOSPITAL_BASED_OUTPATIENT_CLINIC_OR_DEPARTMENT_OTHER): Payer: Self-pay

## 2023-08-02 ENCOUNTER — Other Ambulatory Visit: Payer: Self-pay | Admitting: Nurse Practitioner

## 2023-08-02 ENCOUNTER — Other Ambulatory Visit (HOSPITAL_BASED_OUTPATIENT_CLINIC_OR_DEPARTMENT_OTHER): Payer: Self-pay

## 2023-08-02 DIAGNOSIS — E7800 Pure hypercholesterolemia, unspecified: Secondary | ICD-10-CM

## 2023-08-02 MED ORDER — ROSUVASTATIN CALCIUM 20 MG PO TABS
20.0000 mg | ORAL_TABLET | Freq: Every day | ORAL | 3 refills | Status: AC
  Filled 2023-08-02 – 2023-08-13 (×2): qty 90, 90d supply, fill #0

## 2023-08-03 ENCOUNTER — Encounter: Payer: Self-pay | Admitting: Nurse Practitioner

## 2023-08-03 ENCOUNTER — Other Ambulatory Visit (HOSPITAL_BASED_OUTPATIENT_CLINIC_OR_DEPARTMENT_OTHER): Payer: Self-pay

## 2023-08-12 ENCOUNTER — Other Ambulatory Visit (HOSPITAL_BASED_OUTPATIENT_CLINIC_OR_DEPARTMENT_OTHER): Payer: Self-pay

## 2023-08-13 ENCOUNTER — Other Ambulatory Visit (HOSPITAL_BASED_OUTPATIENT_CLINIC_OR_DEPARTMENT_OTHER): Payer: Self-pay

## 2023-08-13 ENCOUNTER — Other Ambulatory Visit: Payer: Self-pay

## 2023-08-19 ENCOUNTER — Encounter (HOSPITAL_BASED_OUTPATIENT_CLINIC_OR_DEPARTMENT_OTHER): Payer: Self-pay

## 2023-08-20 ENCOUNTER — Other Ambulatory Visit (HOSPITAL_BASED_OUTPATIENT_CLINIC_OR_DEPARTMENT_OTHER): Payer: Self-pay

## 2023-08-22 ENCOUNTER — Other Ambulatory Visit (HOSPITAL_BASED_OUTPATIENT_CLINIC_OR_DEPARTMENT_OTHER): Payer: Self-pay

## 2023-11-19 ENCOUNTER — Other Ambulatory Visit (HOSPITAL_BASED_OUTPATIENT_CLINIC_OR_DEPARTMENT_OTHER): Payer: Self-pay

## 2023-11-19 ENCOUNTER — Other Ambulatory Visit: Payer: Self-pay | Admitting: Nurse Practitioner

## 2023-11-19 DIAGNOSIS — E038 Other specified hypothyroidism: Secondary | ICD-10-CM

## 2023-11-19 MED ORDER — LEVOTHYROXINE SODIUM 25 MCG PO TABS: 25.0000 ug | ORAL_TABLET | Freq: Every day | ORAL | 0 refills | Status: AC
